# Patient Record
Sex: Female | Born: 2000 | Hispanic: No | Marital: Married | Smoking: Never smoker
Health system: Southern US, Community
[De-identification: ages and names within clinical notes are randomized; demographics above are authoritative.]

## PROBLEM LIST (undated history)

## (undated) ENCOUNTER — Inpatient Hospital Stay (HOSPITAL_COMMUNITY): Payer: Self-pay

## (undated) DIAGNOSIS — Z789 Other specified health status: Secondary | ICD-10-CM

## (undated) HISTORY — PX: NO PAST SURGERIES: SHX2092

---
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
## 2018-01-01 ENCOUNTER — Other Ambulatory Visit: Payer: Self-pay

## 2018-01-01 ENCOUNTER — Ambulatory Visit (INDEPENDENT_AMBULATORY_CARE_PROVIDER_SITE_OTHER): Payer: Medicaid Other

## 2018-01-01 VITALS — BP 128/83 | HR 84 | Wt 146.0 lb

## 2018-01-01 DIAGNOSIS — O163 Unspecified maternal hypertension, third trimester: Secondary | ICD-10-CM

## 2018-01-01 NOTE — Patient Instructions (Signed)
Postpartum Hypertension °Postpartum hypertension is high blood pressure after pregnancy that remains higher than normal for more than two days after delivery. You may not realize that you have postpartum hypertension if your blood pressure is not being checked regularly. In some cases, postpartum hypertension will go away on its own, usually within a week of delivery. However, for some women, medical treatment is required to prevent serious complications, such as seizures or stroke. °The following things can affect your blood pressure: °· The type of delivery you had. °· Having received IV fluids or other medicines during or after delivery. ° °What are the causes? °Postpartum hypertension may be caused by any of the following or by a combination of any of the following: °· Hypertension that existed before pregnancy (chronic hypertension). °· Gestational hypertension. °· Preeclampsia or eclampsia. °· Receiving a lot of fluid through an IV during or after delivery. °· Medicines. °· HELLP syndrome. °· Hyperthyroidism. °· Stroke. °· Other rare neurological or blood disorders. ° °In some cases, the cause may not be known. °What increases the risk? °Postpartum hypertension can be related to one or more risk factors, such as: °· Chronic hypertension. In some cases, this may not have been diagnosed before pregnancy. °· Obesity. °· Type 2 diabetes. °· Kidney disease. °· Family history of preeclampsia. °· Other medical conditions that cause hormonal imbalances. ° °What are the signs or symptoms? °As with all types of hypertension, postpartum hypertension may not have any symptoms. Depending on how high your blood pressure is, you may experience: °· Headaches. These may be mild, moderate, or severe. They may also be steady, constant, or sudden in onset (thunderclap headache). °· Visual changes. °· Dizziness. °· Shortness of breath. °· Swelling of your hands, feet, lower legs, or face. In some cases, you may have swelling in  more than one of these locations. °· Heart palpitations or a racing heartbeat. °· Difficulty breathing while lying down. °· Decreased urination. ° °Other rare signs and symptoms may include: °· Sweating more than usual. This lasts longer than a few days after delivery. °· Chest pain. °· Sudden dizziness when you get up from sitting or lying down. °· Seizures. °· Nausea or vomiting. °· Abdominal pain. ° °How is this diagnosed? °The diagnosis of postpartum hypertension is made through a combination of physical examination findings and testing of your blood and urine. You may also have additional tests, such as a CT scan or an MRI, to check for other complications of postpartum hypertension. °How is this treated? °When blood pressure is high enough to require treatment, your options may include: °· Medicines to reduce blood pressure (antihypertensives). Tell your health care provider if you are breastfeeding or if you plan to breastfeed. There are many antihypertensive medicines that are safe to take while breastfeeding. °· Stopping medicines that may be causing hypertension. °· Treating medical conditions that are causing hypertension. °· Treating the complications of hypertension, such as seizures, stroke, or kidney problems. ° °Your health care provider will also continue to monitor your blood pressure closely and repeatedly until it is within a safe range for you. °Follow these instructions at home: °· Take medicines only as directed by your health care provider. °· Get regular exercise after your health care provider tells you that it is safe. °· Follow your health care provider’s recommendations on fluid and salt restrictions. °· Do not use any tobacco products, including cigarettes, chewing tobacco, or electronic cigarettes. If you need help quitting, ask your health care provider. °·   Keep all follow-up visits as directed by your health care provider. This is important. °Contact a health care provider  if: °· Your symptoms get worse. °· You have new symptoms, such as: °? Headache. °? Dizziness. °? Visual changes. °Get help right away if: °· You develop a severe or sudden headache. °· You have seizures. °· You develop numbness or weakness on one side of your body. °· You have difficulty thinking, speaking, or swallowing. °· You develop severe abdominal pain. °· You develop difficulty breathing, chest pain, a racing heartbeat, or heart palpitations. °These symptoms may represent a serious problem that is an emergency. Do not wait to see if the symptoms will go away. Get medical help right away. Call your local emergency services (911 in the [place] Do not drive yourself to the hospital. °This information is not intended to replace advice given to you by your health care provider. Make sure you discuss any questions you have with your health care provider. °Document Released: 02/25/2014 Document Revised: 11/27/2015 Document Reviewed: 01/06/2014 °Elsevier Interactive Patient Education © 2018 [employer] Inc. ° °

## 2018-01-01 NOTE — Progress Notes (Signed)
Patient here for post partum blood pressure check.  Patient delivered 12/24/2017 vaginally.  Patient is taking amlodipine daily.  Denies headache or visual changes.  Per Dr Marice Potterove, patient can stop amlodipine and follow up in 1 week for blood pressure check.

## 2018-01-09 ENCOUNTER — Ambulatory Visit (INDEPENDENT_AMBULATORY_CARE_PROVIDER_SITE_OTHER): Payer: Medicaid Other | Admitting: General Practice

## 2018-01-09 VITALS — BP 133/70 | HR 63 | Ht 60.0 in | Wt 143.0 lb

## 2018-01-09 DIAGNOSIS — Z0130 Encounter for examination of blood pressure without abnormal findings: Secondary | ICD-10-CM

## 2018-01-09 NOTE — Progress Notes (Signed)
Patient presents to office today for BP check. Per chart review, patient delivered SVD on 6/19 & came to office last week for BP check. Norvasc was discontinued at that time so she is following up today. Patient reports continued headaches but they are relieved by medication. Patient denies dizziness or blurry vision.  Reviewed BP with Dr Debroah LoopArnold who states patient can follow up at pp visit. Patient had no questions.

## 2018-01-30 ENCOUNTER — Encounter: Payer: Self-pay | Admitting: Student

## 2018-01-30 ENCOUNTER — Ambulatory Visit (INDEPENDENT_AMBULATORY_CARE_PROVIDER_SITE_OTHER): Payer: Medicaid Other | Admitting: Student

## 2018-01-30 NOTE — Progress Notes (Deleted)
Subjective:     Lindsey Bennett is a 17 y.o. female who presents for a postpartum visit. She is {1-10:13787} {time; units:18646} postpartum following a {delivery:12449}. I have fully reviewed the prenatal and intrapartum course. The delivery was at *** gestational weeks. Outcome: {delivery outcome:32078}. Anesthesia: {anesthesia types:812}. Postpartum course has been ***. Baby's course has been ***. Baby is feeding by {breast/bottle:69}. Bleeding {vag bleed:12292}. Bowel function is {normal:32111}. Bladder function is {normal:32111}. Patient {is/is not:9024} sexually active. Contraception method is {contraceptive method:5051}. Postpartum depression screening: {neg default:13464::"negative"}.  {Common ambulatory SmartLinks:19316}  Review of Systems {ros; complete:30496}   Objective:    LMP  (Approximate)   General:  {gen appearance:16600}   Breasts:  {breast exam:1202::"inspection negative, no nipple discharge or bleeding, no masses or nodularity palpable"}  Lungs: {lung exam:16931}  Heart:  {heart exam:5510}  Abdomen: {abdomen exam:16834}   Vulva:  {labia exam:12198}  Vagina: {vagina exam:12200}  Cervix:  {cervix exam:14595}  Corpus: {uterus exam:12215}  Adnexa:  {adnexa exam:12223}  Rectal Exam: {rectal/vaginal exam:12274}        Assessment:    *** postpartum exam. Pap smear {done:10129} at today's visit.   Plan:    1. Contraception: {method:5051} 2. *** 3. Follow up in: {1-10:13787} {time; units:19136} or as needed.

## 2018-01-30 NOTE — Patient Instructions (Signed)
Ethinyl Estradiol; Norelgestromin skin patches What is this medicine? ETHINYL ESTRADIOL;NORELGESTROMIN (ETH in il es tra DYE ole; nor el JES troe min) skin patch is used as a contraceptive (birth control method). This medicine combines two types of female hormones, an estrogen and a progestin. This patch is used to prevent ovulation and pregnancy. This medicine may be used for other purposes; ask your health care provider or pharmacist if you have questions. COMMON BRAND NAME(S): Ortho Evra, Xulane What should I tell my health care provider before I take this medicine? They need to know if you have or ever had any of these conditions: -abnormal vaginal bleeding -blood vessel disease or blood clots -breast, cervical, endometrial, ovarian, liver, or uterine cancer -diabetes -gallbladder disease -heart disease or recent heart attack -high blood pressure -high cholesterol -kidney disease -liver disease -migraine headaches -stroke -systemic lupus erythematosus (SLE) -tobacco smoker -an unusual or allergic reaction to estrogens, progestins, other medicines, foods, dyes, or preservatives -pregnant or trying to get pregnant -breast-feeding How should I use this medicine? This patch is applied to the skin. Follow the directions on the prescription label. Apply to clean, dry, healthy skin on the buttock, abdomen, upper outer arm or upper torso, in a place where it will not be rubbed by tight clothing. Do not use lotions or other cosmetics on the site where the patch will go. Press the patch firmly in place for 10 seconds to ensure good contact with the skin. Change the patch every 7 days on the same day of the week for 3 weeks. You will then have a break from the patch for 1 week, after which you will apply a new patch. Do not use your medicine more often than directed. Contact your pediatrician regarding the use of this medicine in children. Special care may be needed. This medicine has been used in  female children who have started having menstrual periods. A patient package insert for the product will be given with each prescription and refill. Read this sheet carefully each time. The sheet may change frequently. Overdosage: If you think you have taken too much of this medicine contact a poison control center or emergency room at once. NOTE: This medicine is only for you. Do not share this medicine with others. What if I miss a dose? You will need to replace your patch once a week as directed. If your patch is lost or falls off, contact your health care professional for advice. You may need to use another form of birth control if your patch has been off for more than 1 day. What may interact with this medicine? Do not take this medicine with the following medication: -dasabuvir; ombitasvir; paritaprevir; ritonavir -ombitasvir; paritaprevir; ritonavir This medicine may also interact with the following medications: -acetaminophen -antibiotics or medicines for infections, especially rifampin, rifabutin, rifapentine, and griseofulvin, and possibly penicillins or tetracyclines -aprepitant -ascorbic acid (vitamin C) -atorvastatin -barbiturate medicines, such as phenobarbital -bosentan -carbamazepine -caffeine -clofibrate -cyclosporine -dantrolene -doxercalciferol -felbamate -grapefruit juice -hydrocortisone -medicines for anxiety or sleeping problems, such as diazepam or temazepam -medicines for diabetes, including pioglitazone -modafinil -mycophenolate -nefazodone -oxcarbazepine -phenytoin -prednisolone -ritonavir or other medicines for HIV infection or AIDS -rosuvastatin -selegiline -soy isoflavones supplements -St. John's wort -tamoxifen or raloxifene -theophylline -thyroid hormones -topiramate -warfarin This list may not describe all possible interactions. Give your health care provider a list of all the medicines, herbs, non-prescription drugs, or dietary supplements  you use. Also tell them if you smoke, drink alcohol, or use illegal drugs.   Some items may interact with your medicine. What should I watch for while using this medicine? Visit your doctor or health care professional for regular checks on your progress. You will need a regular breast and pelvic exam and Pap smear while on this medicine. Use an additional method of contraception during the first cycle that you use this patch. If you have any reason to think you are pregnant, stop using this medicine right away and contact your doctor or health care professional. If you are using this medicine for hormone related problems, it may take several cycles of use to see improvement in your condition. Smoking increases the risk of getting a blood clot or having a stroke while you are using hormonal birth control, especially if you are more than 19 years old. You are strongly advised not to smoke. This medicine can make your body retain fluid, making your fingers, hands, or ankles swell. Your blood pressure can go up. Contact your doctor or health care professional if you feel you are retaining fluid. This medicine can make you more sensitive to the sun. Keep out of the sun. If you cannot avoid being in the sun, wear protective clothing and use sunscreen. Do not use sun lamps or tanning beds/booths. If you wear contact lenses and notice visual changes, or if the lenses begin to feel uncomfortable, consult your eye care specialist. In some women, tenderness, swelling, or minor bleeding of the gums may occur. Notify your dentist if this happens. Brushing and flossing your teeth regularly may help limit this. See your dentist regularly and inform your dentist of the medicines you are taking. If you are going to have elective surgery or a MRI, you may need to stop using this medicine before the surgery or MRI. Consult your health care professional for advice. This medicine does not protect you against HIV infection  (AIDS) or any other sexually transmitted diseases. What side effects may I notice from receiving this medicine? Side effects that you should report to your doctor or health care professional as soon as possible: -breast tissue changes or discharge -changes in vaginal bleeding during your period or between your periods -chest pain -coughing up blood -dizziness or fainting spells -headaches or migraines -leg, arm or groin pain -severe or sudden headaches -stomach pain (severe) -sudden shortness of breath -sudden loss of coordination, especially on one side of the body -speech problems -symptoms of vaginal infection like itching, irritation or unusual discharge -tenderness in the upper abdomen -vomiting -weakness or numbness in the arms or legs, especially on one side of the body -yellowing of the eyes or skin Side effects that usually do not require medical attention (report to your doctor or health care professional if they continue or are bothersome): -breakthrough bleeding and spotting that continues beyond the 3 initial cycles of pills -breast tenderness -mood changes, anxiety, depression, frustration, anger, or emotional outbursts -increased sensitivity to sun or ultraviolet light -nausea -skin rash, acne, or brown spots on the skin -weight gain (slight) This list may not describe all possible side effects. Call your doctor for medical advice about side effects. You may report side effects to FDA at 1-800-FDA-1088. Where should I keep my medicine? Keep out of the reach of children. Store at room temperature between 15 and 30 degrees C (59 and 86 degrees F). Keep the patch in its pouch until time of use. Throw away any unused medicine after the expiration date. Dispose of used patches properly. Since a used patch may   still contain active hormones, fold the patch in half so that it sticks to itself prior to disposal. Throw away in a place where children or pets cannot reach. NOTE:  This sheet is a summary. It may not cover all possible information. If you have questions about this medicine, talk to your doctor, pharmacist, or health care provider.  2018 Elsevier/Gold Standard (2016-03-04 07:59:03) Intrauterine Device Information An intrauterine device (IUD) is inserted into your uterus to prevent pregnancy. There are two types of IUDs available:  Copper IUD-This type of IUD is wrapped in copper wire and is placed inside the uterus. Copper makes the uterus and fallopian tubes produce a fluid that kills sperm. The copper IUD can stay in place for 10 years.  Hormone IUD-This type of IUD contains the hormone progestin (synthetic progesterone). The hormone thickens the cervical mucus and prevents sperm from entering the uterus. It also thins the uterine lining to prevent implantation of a fertilized egg. The hormone can weaken or kill the sperm that get into the uterus. One type of hormone IUD can stay in place for 5 years, and another type can stay in place for 3 years.  Your health care provider will make sure you are a good candidate for a contraceptive IUD. Discuss with your health care provider the possible side effects. Advantages of an intrauterine device  IUDs are highly effective, reversible, long acting, and low maintenance.  There are no estrogen-related side effects.  An IUD can be used when breastfeeding.  IUDs are not associated with weight gain.  The copper IUD works immediately after insertion.  The hormone IUD works right away if inserted within 7 days of your period starting. You will need to use a backup method of birth control for 7 days if the hormone IUD is inserted at any other time in your cycle.  The copper IUD does not interfere with your female hormones.  The hormone IUD can make heavy menstrual periods lighter and decrease cramping.  The hormone IUD can be used for 3 or 5 years.  The copper IUD can be used for 10 years. Disadvantages of an  intrauterine device  The hormone IUD can be associated with irregular bleeding patterns.  The copper IUD can make your menstrual flow heavier and more painful.  You may experience cramping and vaginal bleeding after insertion. This information is not intended to replace advice given to you by your health care provider. Make sure you discuss any questions you have with your health care provider. Document Released: 05/28/2004 Document Revised: 11/30/2015 Document Reviewed: 12/13/2012 Elsevier Interactive Patient Education  2017 Elsevier Inc. Etonogestrel implant What is this medicine? ETONOGESTREL (et oh noe JES trel) is a contraceptive (birth control) device. It is used to prevent pregnancy. It can be used for up to 3 years. This medicine may be used for other purposes; ask your health care provider or pharmacist if you have questions. COMMON BRAND NAME(S): Implanon, Nexplanon What should I tell my health care provider before I take this medicine? They need to know if you have any of these conditions: -abnormal vaginal bleeding -blood vessel disease or blood clots -cancer of the breast, cervix, or liver -depression -diabetes -gallbladder disease -headaches -heart disease or recent heart attack -high blood pressure -high cholesterol -kidney disease -liver disease -renal disease -seizures -tobacco smoker -an unusual or allergic reaction to etonogestrel, other hormones, anesthetics or antiseptics, medicines, foods, dyes, or preservatives -pregnant or trying to get pregnant -breast-feeding How should I use this  medicine? This device is inserted just under the skin on the inner side of your upper arm by a health care professional. Talk to your pediatrician regarding the use of this medicine in children. Special care may be needed. Overdosage: If you think you have taken too much of this medicine contact a poison control center or emergency room at once. NOTE: This medicine is only  for you. Do not share this medicine with others. What if I miss a dose? This does not apply. What may interact with this medicine? Do not take this medicine with any of the following medications: -amprenavir -bosentan -fosamprenavir This medicine may also interact with the following medications: -barbiturate medicines for inducing sleep or treating seizures -certain medicines for fungal infections like ketoconazole and itraconazole -grapefruit juice -griseofulvin -medicines to treat seizures like carbamazepine, felbamate, oxcarbazepine, phenytoin, topiramate -modafinil -phenylbutazone -rifampin -rufinamide -some medicines to treat HIV infection like atazanavir, indinavir, lopinavir, nelfinavir, tipranavir, ritonavir -St. John's wort This list may not describe all possible interactions. Give your health care provider a list of all the medicines, herbs, non-prescription drugs, or dietary supplements you use. Also tell them if you smoke, drink alcohol, or use illegal drugs. Some items may interact with your medicine. What should I watch for while using this medicine? This product does not protect you against HIV infection (AIDS) or other sexually transmitted diseases. You should be able to feel the implant by pressing your fingertips over the skin where it was inserted. Contact your doctor if you cannot feel the implant, and use a non-hormonal birth control method (such as condoms) until your doctor confirms that the implant is in place. If you feel that the implant may have broken or become bent while in your arm, contact your healthcare provider. What side effects may I notice from receiving this medicine? Side effects that you should report to your doctor or health care professional as soon as possible: -allergic reactions like skin rash, itching or hives, swelling of the face, lips, or tongue -breast lumps -changes in emotions or moods -depressed mood -heavy or prolonged menstrual  bleeding -pain, irritation, swelling, or bruising at the insertion site -scar at site of insertion -signs of infection at the insertion site such as fever, and skin redness, pain or discharge -signs of pregnancy -signs and symptoms of a blood clot such as breathing problems; changes in vision; chest pain; severe, sudden headache; pain, swelling, warmth in the leg; trouble speaking; sudden numbness or weakness of the face, arm or leg -signs and symptoms of liver injury like dark yellow or brown urine; general ill feeling or flu-like symptoms; light-colored stools; loss of appetite; nausea; right upper belly pain; unusually weak or tired; yellowing of the eyes or skin -unusual vaginal bleeding, discharge -signs and symptoms of a stroke like changes in vision; confusion; trouble speaking or understanding; severe headaches; sudden numbness or weakness of the face, arm or leg; trouble walking; dizziness; loss of balance or coordination Side effects that usually do not require medical attention (report to your doctor or health care professional if they continue or are bothersome): -acne -back pain -breast pain -changes in weight -dizziness -general ill feeling or flu-like symptoms -headache -irregular menstrual bleeding -nausea -sore throat -vaginal irritation or inflammation This list may not describe all possible side effects. Call your doctor for medical advice about side effects. You may report side effects to FDA at 1-800-FDA-1088. Where should I keep my medicine? This drug is given in a hospital or clinic and will  not be stored at home. NOTE: This sheet is a summary. It may not cover all possible information. If you have questions about this medicine, talk to your doctor, pharmacist, or health care provider.  2018 Elsevier/Gold Standard (2016-01-11 11:19:22)

## 2018-01-30 NOTE — Progress Notes (Addendum)
Subjective:     Lindsey Bennett is a 17 y.o. female who presents for a postpartum visit. She is 5 weeks postpartum following a spontaneous vaginal delivery. I have fully reviewed the prenatal and intrapartum course. The delivery was at 37 gestational weeks. Outcome: spontaneous vaginal delivery. Anesthesia: epidural. Postpartum course has been uneventful. Baby's course has been uneventful. Baby is feeding by breast. Bleeding no bleeding. Bowel function is normal. Bladder function is normal. Patient is not sexually active. Contraception method is none. Postpartum depression screening: negative.  The following portions of the patient's history were reviewed and updated as appropriate: allergies, current medications, past family history, past medical history, past social history, past surgical history and problem list.  Review of Systems Pertinent items are noted in HPI.   Objective:    BP 123/69   Pulse 97   Ht 5' (1.524 m)   Wt 145 lb 12.8 oz (66.1 kg)   LMP  (Approximate)   Breastfeeding? Yes   BMI 28.47 kg/m   General:  alert, cooperative and no distress   Breasts:  inspection negative, no nipple discharge or bleeding, no masses or nodularity palpable  Lungs: clear to auscultation bilaterally  Heart:  regular rate and rhythm, S1, S2 normal, no murmur, click, rub or gallop  Abdomen: soft, non-tender; bowel sounds normal; no masses,  no organomegaly   Vulva:  normal  Vagina: not evaluated  Cervix:  not evaluated  Corpus: not examined  Adnexa:  not evaluated  Rectal Exam: Not performed.        Assessment:    Normal postpartum exam. Pap smear not done at today's visit.  BP normal; not taking any medications.  Plan:    1. Contraception: none 2. Undecided on birth control; will come back in 2 weeks.  3. Follow up in: 2 weeks.

## 2018-01-30 NOTE — Progress Notes (Signed)
Pt is Undecided about contraceptive would like to discuss.

## 2018-02-24 ENCOUNTER — Ambulatory Visit: Payer: Medicaid Other | Admitting: Student

## 2018-03-03 ENCOUNTER — Telehealth: Payer: Self-pay | Admitting: Licensed Clinical Social Worker

## 2018-03-03 NOTE — Telephone Encounter (Signed)
Called.  No answer.  Voicemail left.

## 2018-05-12 ENCOUNTER — Encounter: Payer: Self-pay | Admitting: Family Medicine

## 2018-05-12 ENCOUNTER — Ambulatory Visit (INDEPENDENT_AMBULATORY_CARE_PROVIDER_SITE_OTHER): Payer: Medicaid Other | Admitting: Family Medicine

## 2018-05-12 VITALS — BP 113/64 | HR 67

## 2018-05-12 DIAGNOSIS — Z30016 Encounter for initial prescription of transdermal patch hormonal contraceptive device: Secondary | ICD-10-CM

## 2018-05-12 LAB — POCT PREGNANCY, URINE: Preg Test, Ur: NEGATIVE

## 2018-05-12 MED ORDER — NORELGESTROMIN-ETH ESTRADIOL 150-35 MCG/24HR TD PTWK: 1.0000 | MEDICATED_PATCH | TRANSDERMAL | 12 refills | Status: DC

## 2018-05-12 NOTE — Progress Notes (Signed)
   Subjective:    Lindsey Bennett - 17 y.o. female MRN [id number]  Date of birth: October 14, 2000  HPI  Lindsey Bennett is a 17 y.o. G63P1011 female here for birth control. States that she would like to use the patch. This was recommended to her by her mother who is also using the patch. She does not smoke.    OB History    Gravida  2   Para  1   Term  1   Preterm  0   AB  1   Living  1     SAB  1   TAB  0   Ectopic  0   Multiple  0   Live Births  1             Health Maintenance:  Pap: not indicated  Health Maintenance:  Health Maintenance Due  Topic Date Due  . INFLUENZA VACCINE  02/05/2018    -  reports that she has never smoked. She has never used smokeless tobacco. - Review of Systems: Per HPI. - Past Medical History: Patient Active Problem List   Diagnosis Date Noted  . Spontaneous vaginal delivery 12/24/2017   - Medications: reviewed and updated   Objective:   Physical Exam BP 113/64   Pulse 67   LMP 04/28/2018 (Exact Date)  Gen: NAD, alert, cooperative with exam, well-appearing HEENT: NCAT, PER, clear conjunctiva CV: RRR Resp: Non-labored breathing  Skin: no rashes, normal turgor  Neuro: no gross deficits.  Psych: good insight, alert and oriented     Assessment & Plan:   1. Encounter for initial prescription of transdermal patch hormonal contraceptive device - ortho evra prescribed today - DVT precautions discussed - follow up PRN   Routine preventative health maintenance measures emphasized. Please refer to After Visit Summary for other counseling recommendations.   No follow-ups on file.  Gwenevere Abbot, MD OB Fellow  05/12/2018, 7:17 PM

## 2019-03-18 ENCOUNTER — Encounter (HOSPITAL_COMMUNITY): Payer: Self-pay | Admitting: *Deleted

## 2019-03-18 ENCOUNTER — Inpatient Hospital Stay (HOSPITAL_COMMUNITY): Payer: Medicaid Other

## 2019-03-18 ENCOUNTER — Inpatient Hospital Stay (HOSPITAL_COMMUNITY)
Admission: AD | Admit: 2019-03-18 | Discharge: 2019-03-18 | Disposition: A | Discharge status: Home / Self Care | Payer: Medicaid Other | Attending: Obstetrics and Gynecology | Admitting: Obstetrics and Gynecology

## 2019-03-18 ENCOUNTER — Other Ambulatory Visit: Payer: Self-pay

## 2019-03-18 ENCOUNTER — Telehealth: Payer: Self-pay | Admitting: General Practice

## 2019-03-18 DIAGNOSIS — O3680X0 Pregnancy with inconclusive fetal viability, not applicable or unspecified: Secondary | ICD-10-CM

## 2019-03-18 DIAGNOSIS — Z3A01 Less than 8 weeks gestation of pregnancy: Secondary | ICD-10-CM | POA: Insufficient documentation

## 2019-03-18 DIAGNOSIS — O209 Hemorrhage in early pregnancy, unspecified: Secondary | ICD-10-CM | POA: Insufficient documentation

## 2019-03-18 LAB — CBC WITH DIFFERENTIAL/PLATELET
Abs Immature Granulocytes: 0.05 10*3/uL (ref 0.00–0.07)
Basophils Absolute: 0 10*3/uL (ref 0.0–0.1)
Basophils Relative: 0 %
Eosinophils Absolute: 0.2 10*3/uL (ref 0.0–0.5)
Eosinophils Relative: 2 %
HCT: 40.9 % (ref 36.0–46.0)
Hemoglobin: 13.6 g/dL (ref 12.0–15.0)
Immature Granulocytes: 0 %
Lymphocytes Relative: 24 %
Lymphs Abs: 2.7 10*3/uL (ref 0.7–4.0)
MCH: 26.5 pg (ref 26.0–34.0)
MCHC: 33.3 g/dL (ref 30.0–36.0)
MCV: 79.6 fL — ABNORMAL LOW (ref 80.0–100.0)
Monocytes Absolute: 0.6 10*3/uL (ref 0.1–1.0)
Monocytes Relative: 5 %
Neutro Abs: 7.5 10*3/uL (ref 1.7–7.7)
Neutrophils Relative %: 69 %
Platelets: 362 10*3/uL (ref 150–400)
RBC: 5.14 MIL/uL — ABNORMAL HIGH (ref 3.87–5.11)
RDW: 13.7 % (ref 11.5–15.5)
WBC: 11.1 10*3/uL — ABNORMAL HIGH (ref 4.0–10.5)
nRBC: 0 % (ref 0.0–0.2)

## 2019-03-18 LAB — URINALYSIS, ROUTINE W REFLEX MICROSCOPIC
Bilirubin Urine: NEGATIVE
Glucose, UA: NEGATIVE mg/dL
Hgb urine dipstick: NEGATIVE
Ketones, ur: NEGATIVE mg/dL
Leukocytes,Ua: NEGATIVE
Nitrite: NEGATIVE
Protein, ur: NEGATIVE mg/dL
Specific Gravity, Urine: 1.023 (ref 1.005–1.030)
pH: 7 (ref 5.0–8.0)

## 2019-03-18 LAB — COMPREHENSIVE METABOLIC PANEL
ALT: 22 U/L (ref 0–44)
AST: 21 U/L (ref 15–41)
Albumin: 4.1 g/dL (ref 3.5–5.0)
Alkaline Phosphatase: 79 U/L (ref 38–126)
Anion gap: 8 (ref 5–15)
BUN: 7 mg/dL (ref 6–20)
CO2: 26 mmol/L (ref 22–32)
Calcium: 9.4 mg/dL (ref 8.9–10.3)
Chloride: 104 mmol/L (ref 98–111)
Creatinine, Ser: 0.66 mg/dL (ref 0.44–1.00)
GFR calc Af Amer: >60 mL/min (ref >60)
GFR calc non Af Amer: >60 mL/min (ref >60)
Glucose, Bld: 96 mg/dL (ref 70–99)
Potassium: 4 mmol/L (ref 3.5–5.1)
Sodium: 138 mmol/L (ref 135–145)
Total Bilirubin: 0.3 mg/dL (ref 0.3–1.2)
Total Protein: 7.3 g/dL (ref 6.5–8.1)

## 2019-03-18 LAB — ABO/RH: ABO/RH(D): A POS

## 2019-03-18 LAB — POCT PREGNANCY, URINE: Preg Test, Ur: POSITIVE — AB

## 2019-03-18 LAB — HCG, QUANTITATIVE, PREGNANCY: hCG, Beta Chain, Quant, S: 667 m[IU]/mL — ABNORMAL HIGH (ref <5)

## 2019-03-18 NOTE — Telephone Encounter (Signed)
Patient called into front office stating she is pregnant and has started to bleed. She reports LMP 7/30 and had a positive UPT 2 weeks ago at home. She states she has been having breast tenderness and nausea. Patient reports she started spotting yesterday but it stopped later in the day. She states the spotting has returned again and she is having some cramps. Denies recent intercourse. Advised she go to [facility] for evaluation since she has not been seen yet in this pregnancy. Patient verbalized understanding & had no questions.

## 2019-03-18 NOTE — MAU Provider Note (Signed)
Chief Complaint: Vaginal Bleeding   First Provider Initiated Contact with Patient 03/18/19 2039        SUBJECTIVE HPI: Lindsey Bennett is a 18 y.o. G3P1011 at 6437w0d by LMP who presents to maternity admissions reporting pink spotting.  Has some mild cramping.  No severe pain.. She denies vaginal itching/burning, urinary symptoms, h/a, dizziness, n/v, or fever/chills.    RN note: Had some pink spotting yesterday that stopped. Had more spotting this afternoon. Mild cramping in lower abd this afternoon. LMP 02/04/19   Past Medical History:  Diagnosis Date  . Medical history non-contributory    Past Surgical History:  Procedure Laterality Date  . WISDOM TOOTH EXTRACTION Bilateral 2017   Social History   Socioeconomic History  . Marital status: Married    Spouse name: Not on file  . Number of children: Not on file  . Years of education: Not on file  . Highest education level: Not on file  Occupational History  . Not on file  Social Needs  . Financial resource strain: Not on file  . Food insecurity    Worry: Not on file    Inability: Not on file  . Transportation needs    Medical: Not on file    Non-medical: Not on file  Tobacco Use  . Smoking status: Never Smoker  . Smokeless tobacco: Never Used  Substance and Sexual Activity  . Alcohol use: No  . Drug use: No  . Sexual activity: Not Currently  Lifestyle  . Physical activity    Days per week: Not on file    Minutes per session: Not on file  . Stress: Not on file  Relationships  . Social Musicianconnections    Talks on phone: Not on file    Gets together: Not on file    Attends religious service: Not on file    Active member of club or organization: Not on file    Attends meetings of clubs or organizations: Not on file    Relationship status: Not on file  . Intimate partner violence    Fear of current or ex partner: Not on file    Emotionally abused: Not on file    Physically abused: Not on file    Forced sexual  activity: Not on file  Other Topics Concern  . Not on file  Social History Narrative  . Not on file   No current facility-administered medications on file prior to encounter.    Current Outpatient Medications on File Prior to Encounter  Medication Sig Dispense Refill  . Prenatal Vit-Fe Fumarate-FA (PRENATAL MULTIVITAMIN) TABS tablet Take 1 tablet by mouth daily at 12 noon.    . norelgestromin-ethinyl estradiol (ORTHO EVRA) 150-35 MCG/24HR transdermal patch Place 1 patch onto the skin once a week. 3 patch 12   No Known Allergies  I have reviewed patient's Past Medical Hx, Surgical Hx, Family Hx, Social Hx, medications and allergies.   ROS:  Review of Systems  Constitutional: Negative for chills and fever.  Respiratory: Negative for shortness of breath.   Gastrointestinal: Negative for constipation, diarrhea and nausea.  Genitourinary: Positive for pelvic pain, vaginal bleeding and vaginal discharge.   Review of Systems  Other systems negative   Physical Exam  Physical Exam Patient Vitals for the past 24 hrs:  BP Temp Pulse Resp Height Weight  03/18/19 1945 (!) 127/57 98.8 F (37.1 C) 86 18 5\' 1"  (1.549 m) 78 kg   Constitutional: Well-developed, well-nourished female in no acute distress.  Cardiovascular:  normal rate Respiratory: normal effort GI: Abd soft, non-tender. Pos BS x 4 MS: Extremities nontender, no edema, normal ROM Neurologic: Alert and oriented x 4.  GU: Neg CVAT. Declined pelvic exam  LAB RESULTS Results for orders placed or performed during the hospital encounter of 03/18/19 (from the past 24 hour(s))  Urinalysis, Routine w reflex microscopic     Status: None   Collection Time: 03/18/19  7:56 PM  Result Value Ref Range   Color, Urine YELLOW YELLOW   APPearance CLEAR CLEAR   Specific Gravity, Urine 1.023 1.005 - 1.030   pH 7.0 5.0 - 8.0   Glucose, UA NEGATIVE NEGATIVE mg/dL   Hgb urine dipstick NEGATIVE NEGATIVE   Bilirubin Urine NEGATIVE NEGATIVE    Ketones, ur NEGATIVE NEGATIVE mg/dL   Protein, ur NEGATIVE NEGATIVE mg/dL   Nitrite NEGATIVE NEGATIVE   Leukocytes,Ua NEGATIVE NEGATIVE  Pregnancy, urine POC     Status: Abnormal   Collection Time: 03/18/19  7:59 PM  Result Value Ref Range   Preg Test, Ur POSITIVE (A) NEGATIVE  CBC with Differential/Platelet     Status: Abnormal   Collection Time: 03/18/19  8:05 PM  Result Value Ref Range   WBC 11.1 (H) 4.0 - 10.5 K/uL   RBC 5.14 (H) 3.87 - 5.11 MIL/uL   Hemoglobin 13.6 12.0 - 15.0 g/dL   HCT 85.8 85.0 - 27.7 %   MCV 79.6 (L) 80.0 - 100.0 fL   MCH 26.5 26.0 - 34.0 pg   MCHC 33.3 30.0 - 36.0 g/dL   RDW 41.2 87.8 - 67.6 %   Platelets 362 150 - 400 K/uL   nRBC 0.0 0.0 - 0.2 %   Neutrophils Relative % 69 %   Neutro Abs 7.5 1.7 - 7.7 K/uL   Lymphocytes Relative 24 %   Lymphs Abs 2.7 0.7 - 4.0 K/uL   Monocytes Relative 5 %   Monocytes Absolute 0.6 0.1 - 1.0 K/uL   Eosinophils Relative 2 %   Eosinophils Absolute 0.2 0.0 - 0.5 K/uL   Basophils Relative 0 %   Basophils Absolute 0.0 0.0 - 0.1 K/uL   Immature Granulocytes 0 %   Abs Immature Granulocytes 0.05 0.00 - 0.07 K/uL  Comprehensive metabolic panel     Status: None   Collection Time: 03/18/19  8:05 PM  Result Value Ref Range   Sodium 138 135 - 145 mmol/L   Potassium 4.0 3.5 - 5.1 mmol/L   Chloride 104 98 - 111 mmol/L   CO2 26 22 - 32 mmol/L   Glucose, Bld 96 70 - 99 mg/dL   BUN 7 6 - 20 mg/dL   Creatinine, Ser 7.20 0.44 - 1.00 mg/dL   Calcium 9.4 8.9 - 94.7 mg/dL   Total Protein 7.3 6.5 - 8.1 g/dL   Albumin 4.1 3.5 - 5.0 g/dL   AST 21 15 - 41 U/L   ALT 22 0 - 44 U/L   Alkaline Phosphatase 79 38 - 126 U/L   Total Bilirubin 0.3 0.3 - 1.2 mg/dL   GFR calc non Af Amer >60 >60 mL/min   GFR calc Af Amer >60 >60 mL/min   Anion gap 8 5 - 15  ABO/Rh     Status: None   Collection Time: 03/18/19  8:05 PM  Result Value Ref Range   ABO/RH(D) A POS    No rh immune globuloin      NOT A RH IMMUNE GLOBULIN CANDIDATE, PT RH  POSITIVE Performed at [facility],  [address], [place], [place] [address]   hCG, quantitative, pregnancy     Status: Abnormal   Collection Time: 03/18/19  8:05 PM  Result Value Ref Range   hCG, Beta Chain, Quant, S 667 (H) <5 mIU/mL    --/--/A POS (09/10 2005)  IMAGING US Ob Less Than 14 Weeks With Ob Transvaginal  Result Date: 03/18/2019 CLINICAL DATA:  Bleeding EXAM: OBSTETRIC <14 WK [place] AND TRANSVAGINAL OB US TECHNIQUE: Both transabdominal and transvaginal ultrasound examinations were performed for complete evaluation of the gestation as well as the maternal uterus, adnexal regions, and pelvic cul-de-sac. Transvaginal technique was performed to assess early pregnancy. COMPARISON:  None. FINDINGS: Intrauterine gestational sac: Single Yolk sac:  Not visualized Embryo:  Not visualized Cardiac Activity: Not visualized Heart Rate:   bpm MSD: 2.3 mm   4 w   6 d CRL:    mm    w    d                  [place] EDC: Subchorionic hemorrhage:  None visualized. Maternal uterus/adnexae: 2.2 cm simple appearing right paraovarian cyst. Multiple cysts throughout the endometrium and sub endometrium. No suspicious adnexal mass or free fluid. IMPRESSION: Early intrauterine gestational sac, 4 weeks 6 days by mean sac diameter. No yolk sac or fetal pole currently. This could be followed with repeat ultrasound in 14 days to ensure expected progression. No acute maternal findings. Electronically Signed   By: Rolm Baptise M.D.   On: 03/18/2019 20:48    MAU Management/MDM: Ordered usual first trimester r/o ectopic labs.   Will check baseline Ultrasound to rule out ectopic.  This bleeding/pain can represent a normal pregnancy with bleeding, spontaneous abortion or even an ectopic which can be life-threatening.  The process as listed above helps to determine which of these is present.  Reviewed lab results with patient and her husband Reviewed US findings Until we can see a yolk sac, cannot rule out ectopic  yet Recommend repeat HCG on Sat night or Sunday morning Then would repeat US in about 10 days given early Gestation now  ASSESSMENT 1. Vaginal bleeding in pregnancy, first trimester   2.      Cramping first trimester 3.      Pregnancy of unknown location  PLAN Discharge home Plan to repeat HCG level in 48 hours  Will repeat  Ultrasound in about 7-10 days if HCG levels double appropriately  Ectopic precautions  Pt stable at time of discharge. Encouraged to return here or to other Urgent Care/ED if she develops worsening of symptoms, increase in pain, fever, or other concerning symptoms.    Hansel Feinstein CNM, MSN Certified Nurse-Midwife 03/18/2019  8:39 PM

## 2019-03-18 NOTE — Progress Notes (Signed)
Hansel Feinstein CNM in earlier to discuss test results and d/c plan. Written and verbal d/c instructions given and understanding vocied

## 2019-03-18 NOTE — MAU Note (Signed)
Had some pink spotting yesterday that stopped. Had more spotting this afternoon. Mild cramping in lower abd this afternoon. LMP 02/04/19

## 2019-03-18 NOTE — Discharge Instructions (Signed)
Vaginal Bleeding During Pregnancy, First Trimester ° °A small amount of bleeding from the vagina (spotting) is relatively common during early pregnancy. It usually stops on its own. Various things may cause bleeding or spotting during early pregnancy. Some bleeding may be related to the pregnancy, and some may not. In many cases, the bleeding is normal and is not a problem. However, bleeding can also be a sign of something serious. Be sure to tell your health care provider about any vaginal bleeding right away. °Some possible causes of vaginal bleeding during the first trimester include: °· Infection or inflammation of the cervix. °· Growths (polyps) on the cervix. °· Miscarriage or threatened miscarriage. °· Pregnancy tissue developing outside of the uterus (ectopic pregnancy). °· A mass of tissue developing in the uterus due to an egg being fertilized incorrectly (molar pregnancy). °Follow these instructions at home: °Activity °· Follow instructions from your health care provider about limiting your activity. Ask what activities are safe for you. °· If needed, make plans for someone to help with your regular activities. °· Do not have sex or orgasms until your health care provider says that this is safe. °General instructions °· Take over-the-counter and prescription medicines only as told by your health care provider. °· Pay attention to any changes in your symptoms. °· Do not use tampons or douche. °· Write down how many pads you use each day, how often you change pads, and how soaked (saturated) they are. °· If you pass any tissue from your vagina, save the tissue so you can show it to your health care provider. °· Keep all follow-up visits as told by your health care provider. This is important. °Contact a health care provider if: °· You have vaginal bleeding during any part of your pregnancy. °· You have cramps or labor pains. °· You have a fever. °Get help right away if: °· You have severe cramps in your  back or abdomen. °· You pass large clots or a large amount of tissue from your vagina. °· Your bleeding increases. °· You feel light-headed or weak, or you faint. °· You have chills. °· You are leaking fluid or have a gush of fluid from your vagina. °Summary °· A small amount of bleeding (spotting) from the vagina is relatively common during early pregnancy. °· Various things may cause bleeding or spotting in early pregnancy. °· Be sure to tell your health care provider about any vaginal bleeding right away. °This information is not intended to replace advice given to you by your health care provider. Make sure you discuss any questions you have with your health care provider. °Document Released: 04/03/2005 Document Revised: 10/13/2018 Document Reviewed: 09/26/2016 °Elsevier Patient Education © 2020 [employer] ° °

## 2019-03-20 ENCOUNTER — Other Ambulatory Visit: Payer: Self-pay

## 2019-03-20 ENCOUNTER — Inpatient Hospital Stay (HOSPITAL_COMMUNITY)
Admission: AD | Admit: 2019-03-20 | Discharge: 2019-03-20 | Disposition: A | Discharge status: Home / Self Care | Payer: Medicaid Other | Attending: Obstetrics & Gynecology | Admitting: Obstetrics & Gynecology

## 2019-03-20 DIAGNOSIS — Z3A01 Less than 8 weeks gestation of pregnancy: Secondary | ICD-10-CM | POA: Insufficient documentation

## 2019-03-20 DIAGNOSIS — O0281 Inappropriate change in quantitative human chorionic gonadotropin (hCG) in early pregnancy: Secondary | ICD-10-CM

## 2019-03-20 DIAGNOSIS — O209 Hemorrhage in early pregnancy, unspecified: Secondary | ICD-10-CM | POA: Insufficient documentation

## 2019-03-20 DIAGNOSIS — O3680X0 Pregnancy with inconclusive fetal viability, not applicable or unspecified: Secondary | ICD-10-CM

## 2019-03-20 LAB — HCG, QUANTITATIVE, PREGNANCY: hCG, Beta Chain, Quant, S: 741 m[IU]/mL — ABNORMAL HIGH (ref <5)

## 2019-03-20 NOTE — MAU Note (Signed)
Pt reports to MAU for her HCG labs. Pt reports some increase in bleeding but no pain. Pt reports she doesn't feel she needs to be reevaluated for this problem. Pt denies feeling dizzy or lightheaded.

## 2019-03-20 NOTE — MAU Provider Note (Signed)
History     CSN: [id number]  Arrival date and time: 03/20/19 1902   None     Chief Complaint  Patient presents with  . Labs Only   Lindsey Bennett is a 18 y.o. G3P1011 at [redacted]w[redacted]d who presents today for FU HCG. She states that she is unchanged from her visit 2 days ago. She reports that she is still having some cramps, and the bleeding has slightly increased.   Vaginal Bleeding The patient's primary symptoms include pelvic pain and vaginal bleeding. This is a new problem. The current episode started in the past 7 days. The problem occurs intermittently. The problem has been unchanged. She is pregnant. The vaginal bleeding is spotting. She has not been passing clots. She has not been passing tissue. Nothing aggravates the symptoms. She has tried nothing for the symptoms.    OB History    Gravida  3   Para  1   Term  1   Preterm  0   AB  1   Living  1     SAB  1   TAB  0   Ectopic  0   Multiple  0   Live Births  1           Past Medical History:  Diagnosis Date  . Medical history non-contributory     Past Surgical History:  Procedure Laterality Date  . WISDOM TOOTH EXTRACTION Bilateral 2017    No family history on file.  Social History   Tobacco Use  . Smoking status: Never Smoker  . Smokeless tobacco: Never Used  Substance Use Topics  . Alcohol use: No  . Drug use: No    Allergies: No Known Allergies  Medications Prior to Admission  Medication Sig Dispense Refill Last Dose  . Prenatal Vit-Fe Fumarate-FA (PRENATAL MULTIVITAMIN) TABS tablet Take 1 tablet by mouth daily at 12 noon.       Review of Systems  Genitourinary: Positive for pelvic pain and vaginal bleeding.  All other systems reviewed and are negative.  Physical Exam   Blood pressure (!) 135/57, pulse 80, temperature 98.7 F (37.1 C), temperature source Oral, resp. rate 17, last menstrual period 02/04/2019, currently breastfeeding.  Physical Exam  Nursing note and vitals  reviewed. Constitutional: She is oriented to person, place, and time. She appears well-developed. No distress.  HENT:  Head: Normocephalic.  Cardiovascular: Normal rate.  Respiratory: Effort normal.  GI: Soft. There is no abdominal tenderness. There is no rebound.  Neurological: She is alert and oriented to person, place, and time.  Skin: Skin is warm and dry.  Psychiatric: She has a normal mood and affect.   Results for Lindsey Bennett, Lindsey Bennett (MRN [id number]) as of 03/20/2019 20:37  Ref. Range 03/18/2019 20:05 03/18/2019 20:42 03/20/2019 19:32  HCG, Beta Chain, Quant, S Latest Ref Range: <5 mIU/mL 667 (H)  741 (H)   MAU Course  Procedures  MDM Reviewed results with the patient. HCG did not rise as expected. Patient offered MTX or repeat HCG in 48 hours. This is a desired pregnancy, and she would like to repeat HCG on Tuesday before taking definitive active.   Assessment and Plan   1. Pregnancy, location unknown   2. Inappropriate change in quantitative hCG in early pregnancy    DC home 1st Trimester precautions  Bleeding precautions Ectopic precautions RX: none  Return to MAU as needed Repeat HCG in 48 hours, [facility] [place] [facility]   Follow-up Information  [facility] Follow up.   Specialty: Obstetrics and Gynecology Why: Tuesday for repeat blood work  Contact information: [address] [place], [place] [id number] [place] [id number] [place] DNP, [place]  03/20/19  9:00 PM

## 2019-03-20 NOTE — Discharge Instructions (Signed)

## 2019-03-21 ENCOUNTER — Other Ambulatory Visit: Payer: Self-pay

## 2019-03-21 ENCOUNTER — Encounter (HOSPITAL_COMMUNITY): Payer: Self-pay | Admitting: *Deleted

## 2019-03-21 ENCOUNTER — Inpatient Hospital Stay (HOSPITAL_COMMUNITY): Payer: Medicaid Other

## 2019-03-21 ENCOUNTER — Inpatient Hospital Stay (HOSPITAL_COMMUNITY)
Admission: AD | Admit: 2019-03-21 | Discharge: 2019-03-21 | Disposition: A | Discharge status: Home / Self Care | Payer: Medicaid Other | Attending: Obstetrics and Gynecology | Admitting: Obstetrics and Gynecology

## 2019-03-21 DIAGNOSIS — O4690 Antepartum hemorrhage, unspecified, unspecified trimester: Secondary | ICD-10-CM

## 2019-03-21 DIAGNOSIS — Z3A01 Less than 8 weeks gestation of pregnancy: Secondary | ICD-10-CM | POA: Insufficient documentation

## 2019-03-21 DIAGNOSIS — Z6790 Unspecified blood type, Rh positive: Secondary | ICD-10-CM

## 2019-03-21 DIAGNOSIS — O3680X0 Pregnancy with inconclusive fetal viability, not applicable or unspecified: Secondary | ICD-10-CM

## 2019-03-21 DIAGNOSIS — O039 Complete or unspecified spontaneous abortion without complication: Secondary | ICD-10-CM | POA: Insufficient documentation

## 2019-03-21 DIAGNOSIS — O26891 Other specified pregnancy related conditions, first trimester: Secondary | ICD-10-CM

## 2019-03-21 LAB — WET PREP, GENITAL
Clue Cells Wet Prep HPF POC: NONE SEEN
Sperm: NONE SEEN
Trich, Wet Prep: NONE SEEN
Yeast Wet Prep HPF POC: NONE SEEN

## 2019-03-21 LAB — URINALYSIS, ROUTINE W REFLEX MICROSCOPIC
Bacteria, UA: NONE SEEN
Bilirubin Urine: NEGATIVE
Glucose, UA: NEGATIVE mg/dL
Ketones, ur: NEGATIVE mg/dL
Nitrite: NEGATIVE
Protein, ur: 30 mg/dL — AB
RBC / HPF: >50 RBC/hpf — ABNORMAL HIGH (ref 0–5)
Specific Gravity, Urine: 1.025 (ref 1.005–1.030)
pH: 6 (ref 5.0–8.0)

## 2019-03-21 LAB — CBC
HCT: 39.8 % (ref 36.0–46.0)
Hemoglobin: 13.3 g/dL (ref 12.0–15.0)
MCH: 26.7 pg (ref 26.0–34.0)
MCHC: 33.4 g/dL (ref 30.0–36.0)
MCV: 79.9 fL — ABNORMAL LOW (ref 80.0–100.0)
Platelets: 355 10*3/uL (ref 150–400)
RBC: 4.98 MIL/uL (ref 3.87–5.11)
RDW: 13.6 % (ref 11.5–15.5)
WBC: 10.7 10*3/uL — ABNORMAL HIGH (ref 4.0–10.5)
nRBC: 0 % (ref 0.0–0.2)

## 2019-03-21 LAB — HCG, QUANTITATIVE, PREGNANCY: hCG, Beta Chain, Quant, S: 545 m[IU]/mL — ABNORMAL HIGH (ref <5)

## 2019-03-21 NOTE — MAU Provider Note (Signed)
History     CSN: [id number]  Arrival date and time: 03/21/19 1622   First Provider Initiated Contact with Patient 03/21/19 1704      Chief Complaint  Patient presents with  . Vaginal Bleeding  . Abdominal Pain   Ms. Lindsey Bennett is a 18 y.o. G3P1011 at [redacted]w[redacted]d who presents to MAU for vaginal bleeding which began 4days ago. Pt reports the bleeding started out light and pink, but today got heavier and pt reports passing multiple 50 cent-sized clots. Pt reports bleeding is heavier than a period. Pt reports changing pad x2 in past hour and reports pads were saturated with blood.  Of note, this is the patient's 3rd visit to MAU since 03/18/2019. Pt has been diagnosed with a pregnancy of unknown location with an abnormally rising hCG after 48hrs. Pt was offered MTX vs. repeat hCG and pt elected repeat hCG which is scheduled at the [facility] clinic on Tuesday 03/23/2019.  Passing blood clots? yes Blood soaking clothes? no Lightheaded/dizzy? no Significant pelvic pain or cramping? only when passing blood, rates 9/10, denies pelvic pain at this time Passed any tissue? Yes - pt reports she saw "something white" while using restroom in MAU, but threw away pad Hx ectopic pregnancy? no Hx of PID, GYN surgery? no  Current pregnancy problems? pt has not yet been seen this pregnancy Blood Type? A Positive Allergies? NKDA Current medications? none Current PNC & next appt? [facility], 03/23/2019 for hCG  Pt denies vaginal discharge/odor/itching. Pt denies N/V, abdominal pain, constipation, diarrhea, or urinary problems. Pt denies fever, chills, fatigue, sweating or changes in appetite. Pt denies SOB or chest pain. Pt denies dizziness, HA, light-headedness, weakness.  Pt's partner present for entire visit.   OB History    Gravida  3   Para  1   Term  1   Preterm  0   AB  1   Living  1     SAB  1   TAB  0   Ectopic  0   Multiple  0   Live Births  1           Past Medical  History:  Diagnosis Date  . Medical history non-contributory     Past Surgical History:  Procedure Laterality Date  . WISDOM TOOTH EXTRACTION Bilateral 2017    History reviewed. No pertinent family history.  Social History   Tobacco Use  . Smoking status: Never Smoker  . Smokeless tobacco: Never Used  Substance Use Topics  . Alcohol use: No  . Drug use: No    Allergies: No Known Allergies  Medications Prior to Admission  Medication Sig Dispense Refill Last Dose  . Prenatal Vit-Fe Fumarate-FA (PRENATAL MULTIVITAMIN) TABS tablet Take 1 tablet by mouth daily at 12 noon.       Review of Systems  Constitutional: Negative for chills, diaphoresis, fatigue and fever.  Respiratory: Negative for shortness of breath.   Cardiovascular: Negative for chest pain.  Gastrointestinal: Negative for abdominal pain, constipation, diarrhea, nausea and vomiting.  Genitourinary: Positive for pelvic pain and vaginal bleeding. Negative for dysuria, flank pain, frequency, urgency and vaginal discharge.  Neurological: Negative for dizziness, weakness, light-headedness and headaches.   Physical Exam   Blood pressure 131/64, pulse 91, temperature 99.2 F (37.3 C), temperature source Oral, resp. rate 17, last menstrual period 02/04/2019, SpO2 97 %, currently breastfeeding.  Patient Vitals for the past 24 hrs:  BP Temp Temp src Pulse Resp SpO2  03/21/19 1649 131/64 99.2 F (  37.3 C) Oral 91 17 97 %   Physical Exam  Constitutional: She is oriented to person, place, and time. She appears well-developed and well-nourished. No distress.  HENT:  Head: Normocephalic and atraumatic.  Respiratory: Effort normal.  GI: Soft. She exhibits no distension and no mass. There is no abdominal tenderness. There is no rebound and no guarding.  Genitourinary: There is no rash, tenderness or lesion on the right labia. There is no rash, tenderness or lesion on the left labia. Uterus is not enlarged and not tender.  Cervix exhibits no motion tenderness, no discharge and no friability. Right adnexum displays no mass, no tenderness and no fullness. Left adnexum displays no mass, no tenderness and no fullness.    Vaginal bleeding present.     No vaginal discharge or tenderness.  There is bleeding in the vagina. No tenderness in the vagina.    Genitourinary Comments: -minimal blood mixed with mucus visualzed in posterior fornix. No active bleeding visualized. Small clot visible at external os, unable to be removed with Fox swab.   Neurological: She is alert and oriented to person, place, and time.  Skin: Skin is warm and dry. She is not diaphoretic.  Psychiatric: She has a normal mood and affect. Her behavior is normal. Judgment and thought content normal.   Results for orders placed or performed during the hospital encounter of 03/21/19 (from the past 24 hour(s))  Wet prep, genital     Status: Abnormal   Collection Time: 03/21/19  5:11 PM   Specimen: Cervix  Result Value Ref Range   Yeast Wet Prep HPF POC NONE SEEN NONE SEEN   Trich, Wet Prep NONE SEEN NONE SEEN   Clue Cells Wet Prep HPF POC NONE SEEN NONE SEEN   WBC, Wet Prep HPF POC MANY (A) NONE SEEN   Sperm NONE SEEN   CBC     Status: Abnormal   Collection Time: 03/21/19  5:25 PM  Result Value Ref Range   WBC 10.7 (H) 4.0 - 10.5 K/uL   RBC 4.98 3.87 - 5.11 MIL/uL   Hemoglobin 13.3 12.0 - 15.0 g/dL   HCT 18.839.8 41.636.0 - 60.646.0 %   MCV 79.9 (L) 80.0 - 100.0 fL   MCH 26.7 26.0 - 34.0 pg   MCHC 33.4 30.0 - 36.0 g/dL   RDW 30.113.6 60.111.5 - 09.315.5 %   Platelets 355 150 - 400 K/uL   nRBC 0.0 0.0 - 0.2 %  hCG, quantitative, pregnancy     Status: Abnormal   Collection Time: 03/21/19  5:25 PM  Result Value Ref Range   hCG, Beta Chain, Quant, S 545 (H) <5 mIU/mL  Urinalysis, Routine w reflex microscopic     Status: Abnormal   Collection Time: 03/21/19  7:25 PM  Result Value Ref Range   Color, Urine YELLOW YELLOW   APPearance HAZY (A) CLEAR   Specific Gravity,  Urine 1.025 1.005 - 1.030   pH 6.0 5.0 - 8.0   Glucose, UA NEGATIVE NEGATIVE mg/dL   Hgb urine dipstick LARGE (A) NEGATIVE   Bilirubin Urine NEGATIVE NEGATIVE   Ketones, ur NEGATIVE NEGATIVE mg/dL   Protein, ur 30 (A) NEGATIVE mg/dL   Nitrite NEGATIVE NEGATIVE   Leukocytes,Ua TRACE (A) NEGATIVE   RBC / HPF >50 (H) 0 - 5 RBC/hpf   WBC, UA 21-50 0 - 5 WBC/hpf   Bacteria, UA NONE SEEN NONE SEEN   Squamous Epithelial / LPF 6-10 0 - 5   Mucus PRESENT  US Ob Less Than 14 Weeks With Ob Transvaginal  Result Date: 03/21/2019 CLINICAL DATA:  18 year old pregnant female with vaginal bleeding. Estimated gestational age of [redacted] weeks 3 days by LMP. EXAM: OBSTETRIC <14 WK [place] AND TRANSVAGINAL OB US TECHNIQUE: Both transabdominal and transvaginal ultrasound examinations were performed for complete evaluation of the gestation as well as the maternal uterus, adnexal regions, and pelvic cul-de-sac. Transvaginal technique was performed to assess early pregnancy. COMPARISON:  03/18/2019 ultrasound FINDINGS: Intrauterine gestational sac: None. The previously identified possible gestational sac on 03/18/2019 is not visualized. Yolk sac:  Not Visualized. Embryo:  Not Visualized. Maternal uterus/adnexae: The ovaries bilaterally are unremarkable. A trace amount of free pelvic fluid is noted. No suspicious adnexal mass noted. IMPRESSION: 1. No evidence of intrauterine gestational sac. The small cystic intrauterine structure identified on 03/18/2019 is no longer visualized. This is worrisome for 1st trimester pregnancy failure. 2. Trace free fluid.  No suspicious adnexal mass. 3. Consider ultrasound/Beta HCG follow-up. Electronically Signed   By: Margarette Canada M.D.   On: 03/21/2019 18:53   US Ob Less Than 14 Weeks With Ob Transvaginal  Result Date: 03/18/2019 CLINICAL DATA:  Bleeding EXAM: OBSTETRIC <14 WK [place] AND TRANSVAGINAL OB US TECHNIQUE: Both transabdominal and transvaginal ultrasound examinations were performed for  complete evaluation of the gestation as well as the maternal uterus, adnexal regions, and pelvic cul-de-sac. Transvaginal technique was performed to assess early pregnancy. COMPARISON:  None. FINDINGS: Intrauterine gestational sac: Single Yolk sac:  Not visualized Embryo:  Not visualized Cardiac Activity: Not visualized Heart Rate:   bpm MSD: 2.3 mm   4 w   6 d CRL:    mm    w    d                  [place] EDC: Subchorionic hemorrhage:  None visualized. Maternal uterus/adnexae: 2.2 cm simple appearing right paraovarian cyst. Multiple cysts throughout the endometrium and sub endometrium. No suspicious adnexal mass or free fluid. IMPRESSION: Early intrauterine gestational sac, 4 weeks 6 days by mean sac diameter. No yolk sac or fetal pole currently. This could be followed with repeat ultrasound in 14 days to ensure expected progression. No acute maternal findings. Electronically Signed   By: Rolm Baptise M.D.   On: 03/18/2019 20:48   MAU Course  Procedures  MDM -VB + pelvic cramping in first trimester, pregnancy of unknown anatomic location -r/o ectopic -UA: hazy/lg hgb/30PRO/trace leuks, sending urine for culture -CBC: WNL, H/H 13.3/39.8 -[place]: previously identified gestational sac on 03/18/2019 not visualized today, trace amount of free fluid, no suspicious adnexal mass -hCG: 545 (was 741 yesterday) -ABO: A Positive -WetPrep: many WBCs, otherwise WNL -GC/CT collected -suspect miscarriage, discussed with patient and partner -pt discharged to home in stable condition  Orders Placed This Encounter  Procedures  . Wet prep, genital    Standing Status:   Standing    Number of Occurrences:   1  . Culture, OB Urine    Standing Status:   Standing    Number of Occurrences:   1  . US OB LESS THAN 14 WEEKS WITH OB TRANSVAGINAL    Standing Status:   Standing    Number of Occurrences:   1    Order Specific Question:   Symptom/Reason for Exam    Answer:   Vaginal bleeding in pregnancy [[id number]]    Order  Specific Question:   Symptom/Reason for Exam    Answer:   Pregnancy of unknown anatomic location [  [id number]]  . Urinalysis, Routine w reflex microscopic    Standing Status:   Standing    Number of Occurrences:   1  . CBC    Standing Status:   Standing    Number of Occurrences:   1  . hCG, quantitative, pregnancy    Standing Status:   Standing    Number of Occurrences:   1  . Discharge patient    Order Specific Question:   Discharge disposition    Answer:   01-Home or Self Care [1]    Order Specific Question:   Discharge patient date    Answer:   03/21/2019   No orders of the defined types were placed in this encounter.  Assessment and Plan   1. Miscarriage   2. Vaginal bleeding in pregnancy   3. Pregnancy of unknown anatomic location   4. Pelvic pain affecting pregnancy in first trimester, antepartum   5. Blood type, Rh positive    Allergies as of 03/21/2019   No Known Allergies     Medication List    TAKE these medications   prenatal multivitamin Tabs tablet Take 1 tablet by mouth daily at 12 noon.      -will call with culture results, if positive -message sent to [facility] to cancel visits for this week and reschedule for hCG in 1wk and provider/hCG visit in 2wks -pt advised will follow hCG to zero -strict VB/pain/infection/return MAU precautions given -pt discharged to home in stable condition  Joni Reiningicole E Tamecka Milham 03/21/2019, 7:48 PM

## 2019-03-21 NOTE — Discharge Instructions (Signed)
Miscarriage °A miscarriage is the loss of an unborn baby (fetus) before the 20th week of pregnancy. Most miscarriages happen during the first 3 months of pregnancy. Sometimes, a miscarriage can happen before a woman knows that she is pregnant. °Having a miscarriage can be an emotional experience. If you have had a miscarriage, talk with your health care provider about any questions you may have about miscarrying, the grieving process, and your plans for future pregnancy. °What are the causes? °A miscarriage may be caused by: °· Problems with the genes or chromosomes of the fetus. These problems make it impossible for the baby to develop normally. They are often the result of random errors that occur early in the development of the baby, and are not passed from parent to child (not inherited). °· Infection of the cervix or uterus. °· Conditions that affect hormone balance in the body. °· Problems with the cervix, such as the cervix opening and thinning before pregnancy is at term (cervical insufficiency). °· Problems with the uterus. These may include: °? A uterus with an abnormal shape. °? Fibroids in the uterus. °? Congenital abnormalities. These are problems that were present at birth. °· Certain medical conditions. °· Smoking, drinking alcohol, or using drugs. °· Injury (trauma). °In many cases, the cause of a miscarriage is not known. °What are the signs or symptoms? °Symptoms of this condition include: °· Vaginal bleeding or spotting, with or without cramps or pain. °· Pain or cramping in the abdomen or lower back. °· Passing fluid, tissue, or blood clots from the vagina. °How is this diagnosed? °This condition may be diagnosed based on: °· A physical exam. °· Ultrasound. °· Blood tests. °· Urine tests. °How is this treated? °Treatment for a miscarriage is sometimes not necessary if you naturally pass all the tissue that was in your uterus. If necessary, this condition may be treated with: °· Dilation and  curettage (D&C). This is a procedure in which the cervix is stretched open and the lining of the uterus (endometrium) is scraped. This is done only if tissue from the fetus or placenta remains in the body (incomplete miscarriage). °· Medicines, such as: °? Antibiotic medicine, to treat infection. °? Medicine to help the body pass any remaining tissue. °? Medicine to reduce (contract) the size of the uterus. These medicines may be given if you have a lot of bleeding. °If you have Rh negative blood and your baby was Rh positive, you will need a shot of a medicine called Rh immunoglobulinto protect your future babies from Rh blood problems. "Rh-negative" and "Rh-positive" refer to whether or not the blood has a specific protein found on the surface of red blood cells (Rh factor). °Follow these instructions at home: °Medicines ° °· Take over-the-counter and prescription medicines only as told by your health care provider. °· If you were prescribed antibiotic medicine, take it as told by your health care provider. Do not stop taking the antibiotic even if you start to feel better. °· Do not take NSAIDs, such as aspirin and ibuprofen, unless they are approved by your health care provider. These medicines can cause bleeding. °Activity °· Rest as directed. Ask your health care provider what activities are safe for you. °· Have someone help with home and family responsibilities during this time. °General instructions °· Keep track of the number of sanitary pads you use each day and how soaked (saturated) they are. Write down this information. °· Monitor the amount of tissue or blood clots that   you pass from your vagina. Save any large amounts of tissue for your health care provider to examine. °· Do not use tampons, douche, or have sex until your health care provider approves. °· To help you and your partner with the process of grieving, talk with your health care provider or seek counseling. °· When you are ready, meet with  your health care provider to discuss any important steps you should take for your health. Also, discuss steps you should take to have a healthy pregnancy in the future. °· Keep all follow-up visits as told by your health care provider. This is important. °Where to find more information °· The American Congress of Obstetricians [employer] Gynecologists: www.acog.org °· [place] Department of Health and Human Services Office of Women’s Health: www.womenshealth.gov °Contact a health care provider if: °· You have a fever or chills. °· You have a foul smelling vaginal discharge. °· You have more bleeding instead of less. °Get help right away if: °· You have severe cramps or pain in your back or abdomen. °· You pass blood clots or tissue from your vagina that is walnut-sized or larger. °· You soak more than 1 regular sanitary pad in an hour. °· You become light-headed or weak. °· You pass out. °· You have feelings of sadness that take over your thoughts, or you have thoughts of hurting yourself. °Summary °· Most miscarriages happen in the first 3 months of pregnancy. Sometimes miscarriage happens before a woman even knows that she is pregnant. °· Follow your health care provider's instruction for home care. Keep all follow-up appointments. °· To help you and your partner with the process of grieving, talk with your health care provider or seek counseling. °This information is not intended to replace advice given to you by your health care provider. Make sure you discuss any questions you have with your health care provider. °Document Released: 12/18/2000 Document Revised: 10/16/2018 Document Reviewed: 07/30/2016 °Elsevier Patient Education © 2020 [employer] Inc[employer] ° °

## 2019-03-21 NOTE — MAU Note (Signed)
Shaylee Stanislawski is a 18 y.o. at [redacted]w[redacted]d here in MAU reporting:  +vaginal bleeding. States has been bleeding but had gotten really heavy around 2 hours ago. In the past two hours she has changed her pad 3 times and covered front to back she endorses.   +clots; the size of 50 cent pieces  +Lower abdominal pain. Cramping. contant Pain score: 9/10. Has not taken any medication Vitals:   03/21/19 1649  BP: 131/64  Pulse: 91  Resp: 17  Temp: 99.2 F (37.3 C)  SpO2: 97%    Lab orders placed from triage: ua. Patient states she is unable to go at this time.

## 2019-03-23 ENCOUNTER — Other Ambulatory Visit: Payer: Self-pay | Admitting: *Deleted

## 2019-03-23 ENCOUNTER — Ambulatory Visit: Payer: Medicaid Other | Admitting: *Deleted

## 2019-03-23 ENCOUNTER — Encounter: Payer: Self-pay | Admitting: *Deleted

## 2019-03-23 DIAGNOSIS — O039 Complete or unspecified spontaneous abortion without complication: Secondary | ICD-10-CM

## 2019-03-23 LAB — CULTURE, OB URINE: Culture: NO GROWTH

## 2019-03-23 LAB — GC/CHLAMYDIA PROBE AMP (~~LOC~~) NOT AT ARMC
Chlamydia: NEGATIVE
Neisseria Gonorrhea: NEGATIVE

## 2019-03-23 NOTE — Progress Notes (Deleted)
Called pt regarding missed appt today @ 1100 for Stat BHCG. Her mobile number had a busy tone - unable to leave message. She did not answer home phone # and message was left to call us back. MyChart message was also sent to pt.

## 2019-03-25 ENCOUNTER — Ambulatory Visit: Payer: Medicaid Other

## 2019-03-29 ENCOUNTER — Other Ambulatory Visit: Payer: Medicaid Other

## 2019-03-29 ENCOUNTER — Other Ambulatory Visit: Payer: Self-pay

## 2019-03-29 DIAGNOSIS — O039 Complete or unspecified spontaneous abortion without complication: Secondary | ICD-10-CM

## 2019-03-30 LAB — BETA HCG QUANT (REF LAB): hCG Quant: 2 m[IU]/mL

## 2019-04-02 ENCOUNTER — Telehealth: Payer: Self-pay | Admitting: Nurse Practitioner

## 2019-04-02 NOTE — Telephone Encounter (Signed)
Attempted to call patient about her appointment on 9/28 @ 9:35. No answer left voicemail instructing patient to wear a face mask for the entire appointment and no visitors are allowed during the visit. Patient instructed not to attend the appointment if she was any symptoms. Symptom list and office number left.  °

## 2019-04-05 ENCOUNTER — Encounter: Payer: Self-pay | Admitting: Nurse Practitioner

## 2019-04-05 ENCOUNTER — Ambulatory Visit (INDEPENDENT_AMBULATORY_CARE_PROVIDER_SITE_OTHER): Payer: Medicaid Other | Admitting: Nurse Practitioner

## 2019-04-05 ENCOUNTER — Other Ambulatory Visit: Payer: Self-pay

## 2019-04-05 VITALS — BP 126/78 | HR 75 | Temp 98.7°F | Ht 61.0 in | Wt 171.7 lb

## 2019-04-05 DIAGNOSIS — Z3009 Encounter for other general counseling and advice on contraception: Secondary | ICD-10-CM | POA: Diagnosis not present

## 2019-04-05 DIAGNOSIS — Z6832 Body mass index (BMI) 32.0-32.9, adult: Secondary | ICD-10-CM | POA: Insufficient documentation

## 2019-04-05 DIAGNOSIS — O039 Complete or unspecified spontaneous abortion without complication: Secondary | ICD-10-CM | POA: Diagnosis not present

## 2019-04-05 HISTORY — DX: Body mass index (BMI) 32.0-32.9, adult: Z68.32

## 2019-04-05 NOTE — Progress Notes (Signed)
GYNECOLOGY OFFICE VISIT NOTE   History:  18 y.o. [id number] here today for miscarriage follow up. She denies any abnormal vaginal discharge, bleeding, pelvic pain or other concerns. She is sleeping well and eating 3 meals a day.  She no longer is crying and still wonders if having a second miscarriage is normal.  Past Medical History:  Diagnosis Date   Medical history non-contributory     Past Surgical History:  Procedure Laterality Date   WISDOM TOOTH EXTRACTION Bilateral 2017    The following portions of the patient's history were reviewed and updated as appropriate: allergies, current medications, past family history, past medical history, past social history, past surgical history and problem list.   Health Maintenance:  Pap not indicated based on age.  Flu shot she will get at [facility] as insurance does not cover flu shot.  Review of Systems:  Pertinent items noted in HPI and remainder of comprehensive ROS otherwise negative.  Objective:  Physical Exam BP 126/78    Pulse 75    Temp 98.7 F (37.1 C)    Ht 5\' 1"  (1.549 m)    Wt 171 lb 11.2 oz (77.9 kg)    LMP 02/04/2019    Breastfeeding Unknown Comment: SAB   BMI 32.44 kg/m  CONSTITUTIONAL: Well-developed, well-nourished female in no acute distress.  HENT:  Normocephalic, atraumatic. External right and left ear normal.  EYES: Conjunctivae and EOM are normal. Pupils are equal, round.  No scleral icterus.  NECK: Normal range of motion, supple, no masses SKIN: Skin is warm and dry. No rash noted. Not diaphoretic. No erythema. No pallor. NEUROLOGIC: Alert and oriented to person, place, and time. Normal muscle tone coordination. No cranial nerve deficit noted. PSYCHIATRIC: Normal mood and affect. Normal behavior. Normal judgment and thought content. MUSCULOSKELETAL: Normal range of motion. No edema noted.  Labs and Imaging US Ob Less Than 14 Weeks With Ob Transvaginal  Result Date: 03/21/2019 CLINICAL DATA:  18 year old pregnant  female with vaginal bleeding. Estimated gestational age of [redacted] weeks 3 days by LMP. EXAM: OBSTETRIC <14 WK [place] AND TRANSVAGINAL OB US TECHNIQUE: Both transabdominal and transvaginal ultrasound examinations were performed for complete evaluation of the gestation as well as the maternal uterus, adnexal regions, and pelvic cul-de-sac. Transvaginal technique was performed to assess early pregnancy. COMPARISON:  03/18/2019 ultrasound FINDINGS: Intrauterine gestational sac: None. The previously identified possible gestational sac on 03/18/2019 is not visualized. Yolk sac:  Not Visualized. Embryo:  Not Visualized. Maternal uterus/adnexae: The ovaries bilaterally are unremarkable. A trace amount of free pelvic fluid is noted. No suspicious adnexal mass noted. IMPRESSION: 1. No evidence of intrauterine gestational sac. The small cystic intrauterine structure identified on 03/18/2019 is no longer visualized. This is worrisome for 1st trimester pregnancy failure. 2. Trace free fluid.  No suspicious adnexal mass. 3. Consider ultrasound/Beta HCG follow-up. Electronically Signed   By: Margarette Canada M.D.   On: 03/21/2019 18:53   US Ob Less Than 14 Weeks With Ob Transvaginal  Result Date: 03/18/2019 CLINICAL DATA:  Bleeding EXAM: OBSTETRIC <14 WK [place] AND TRANSVAGINAL OB US TECHNIQUE: Both transabdominal and transvaginal ultrasound examinations were performed for complete evaluation of the gestation as well as the maternal uterus, adnexal regions, and pelvic cul-de-sac. Transvaginal technique was performed to assess early pregnancy. COMPARISON:  None. FINDINGS: Intrauterine gestational sac: Single Yolk sac:  Not visualized Embryo:  Not visualized Cardiac Activity: Not visualized Heart Rate:   bpm MSD: 2.3 mm   4 w   6 d  CRL:    mm    w    d                  [place] EDC: Subchorionic hemorrhage:  None visualized. Maternal uterus/adnexae: 2.2 cm simple appearing right paraovarian cyst. Multiple cysts throughout the endometrium and sub  endometrium. No suspicious adnexal mass or free fluid. IMPRESSION: Early intrauterine gestational sac, 4 weeks 6 days by mean sac diameter. No yolk sac or fetal pole currently. This could be followed with repeat ultrasound in 14 days to ensure expected progression. No acute maternal findings. Electronically Signed   By: Charlett Nose M.D.   On: 03/18/2019 20:48   Last BHCG on 03-29-19 was less than 5 and considered to be zero.  Assessment & Plan:  1. Family planning counseling Advised BMI needs to be under 25 for very best health. Does not want contraception at this time. Plans another conception - advise to wait and start after 2 menstrual cycles. Advised to continue PNV or mutivitamin daily in anticipation of upcoming pregnancy  2. Follow-up visit after miscarriage Was worried as she had one miscarriage in 2018 before her son was born and now another miscarriage now in 2020 after she had her son in 2019.  Advised that miscarriage may occur in as many as one in four pregnancies.   Routine preventative health maintenance measures emphasized. Please refer to After Visit Summary for other counseling recommendations.   Return in about 1 year (around 04/04/2020).   Total face-to-face time with patient: 10 minutes.  Over 50% of encounter was spent on counseling and coordination of care.  Nolene Bernheim, RN, MSN, NP-BC Nurse Practitioner, [facility] for [employer], [facility] 04/05/2019 10:15 AM

## 2019-04-16 IMAGING — US US OB TRANSVAGINAL
1 series · 15 of 28 positions shown · non-contrast
Comparison: None.

CLINICAL DATA: First trimester vaginal bleeding.

EXAM:
OBSTETRIC <14 WK US AND TRANSVAGINAL OB US
TECHNIQUE: Both transabdominal and transvaginal ultrasound examinations were
performed for complete evaluation of the gestation as well as the
maternal uterus, adnexal regions, and pelvic cul-de-sac.
Transvaginal technique was performed to assess early pregnancy.

[Series 1: us ob transvaginal · 15 of 75 slices shown]
[im 1/75]
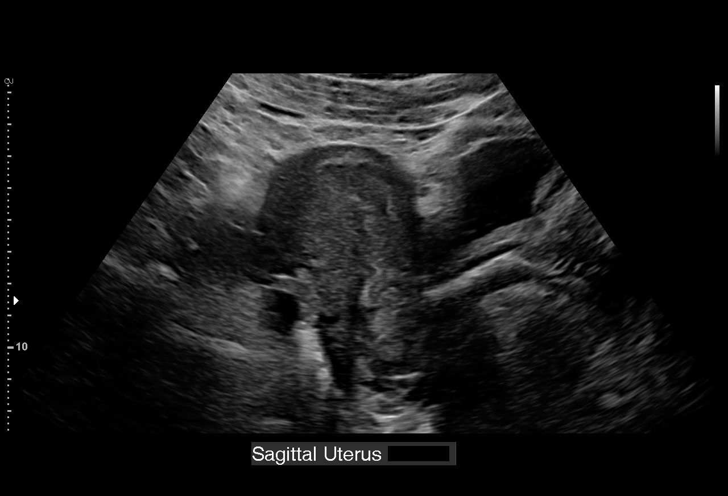
[im 6/75]
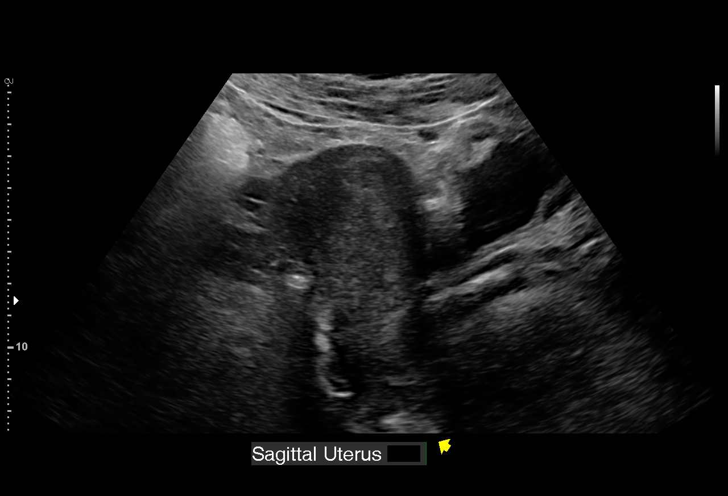
[im 11/75]
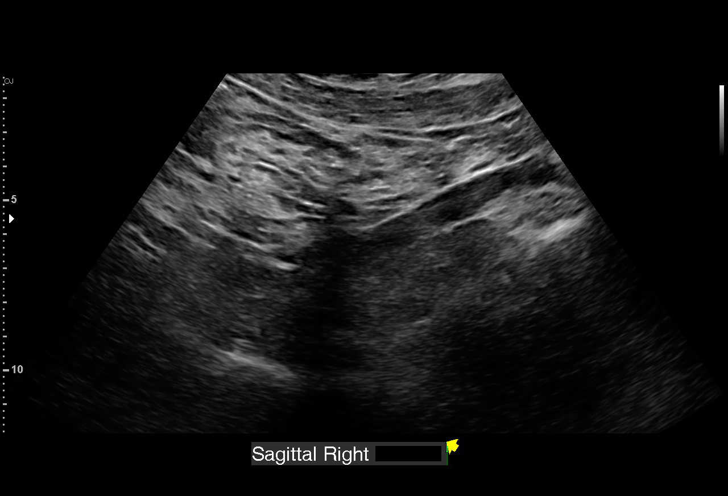
[im 17/75]
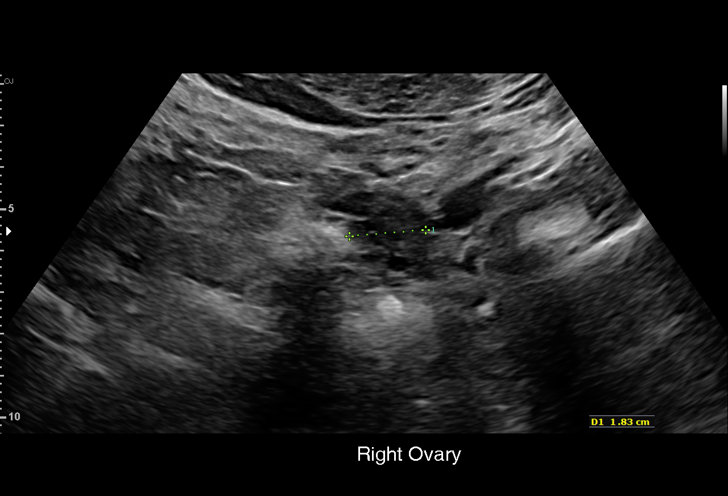
[im 22/75]
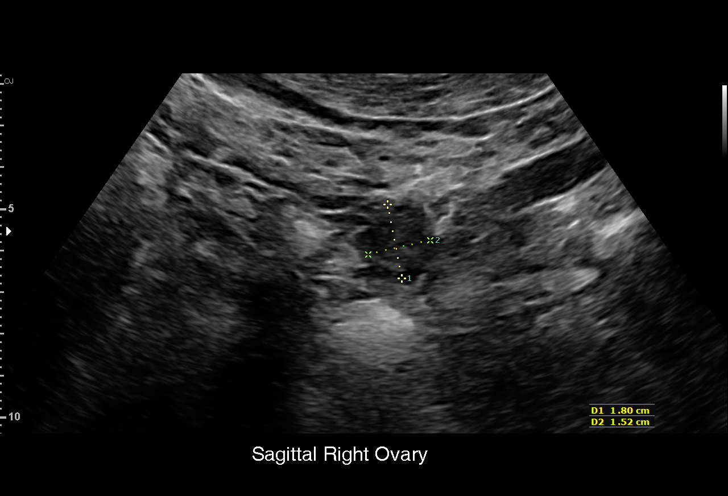
[im 28/75]
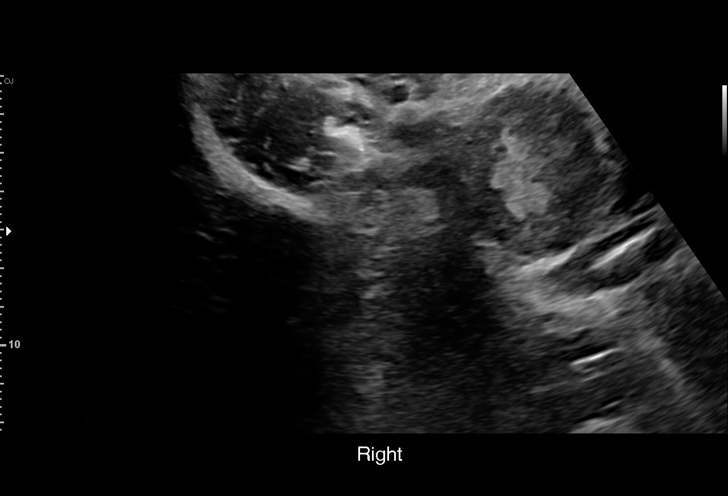
[im 33/75]
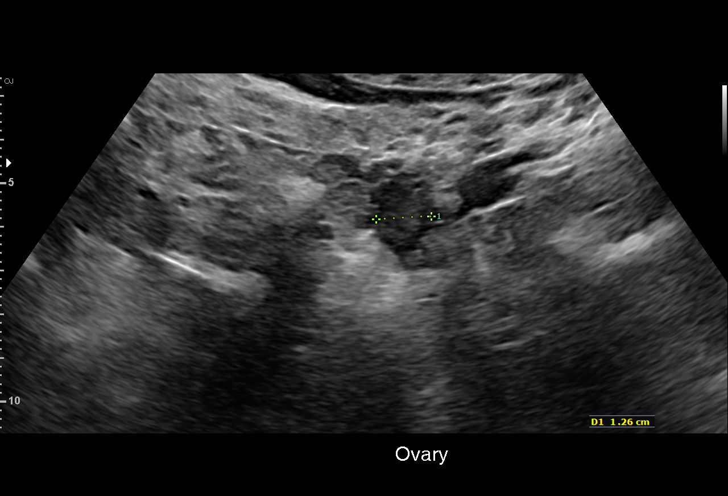
[im 39/75]
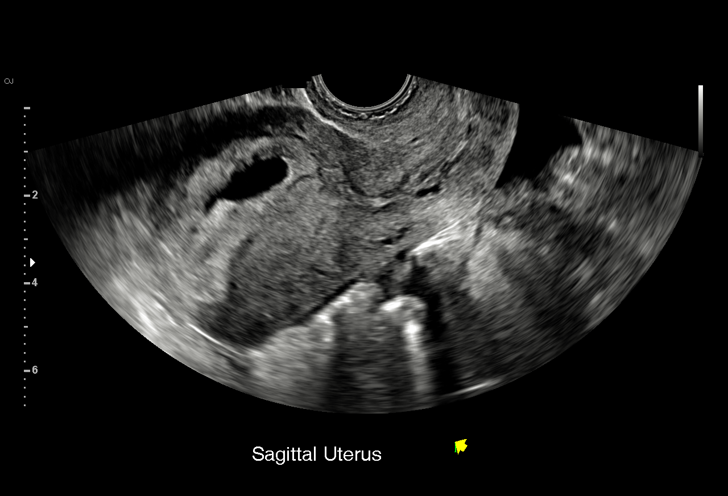
[im 42/75]
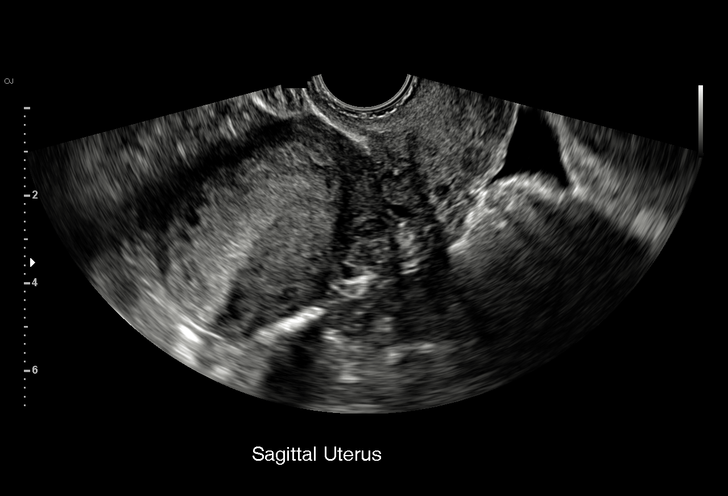
[im 47/75]
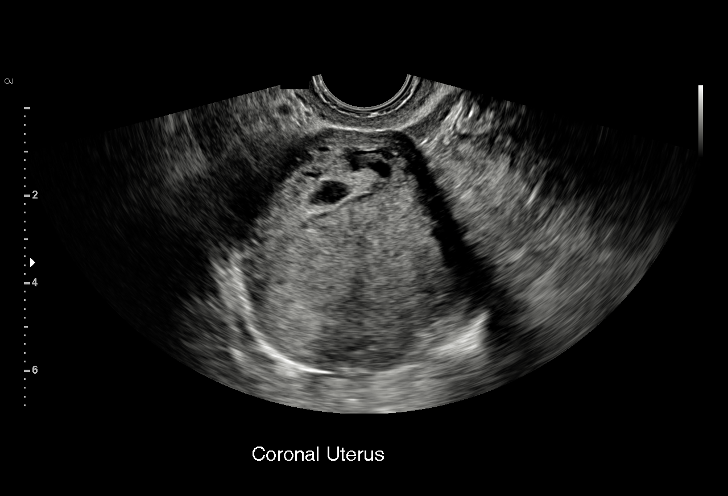
[im 53/75]
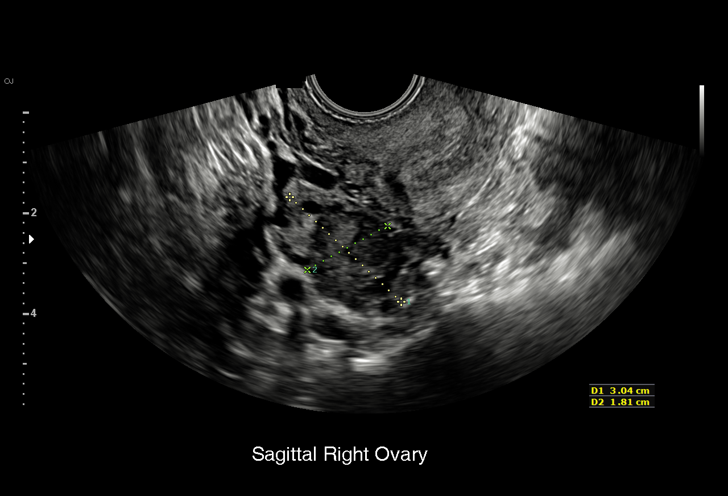
[im 58/75]
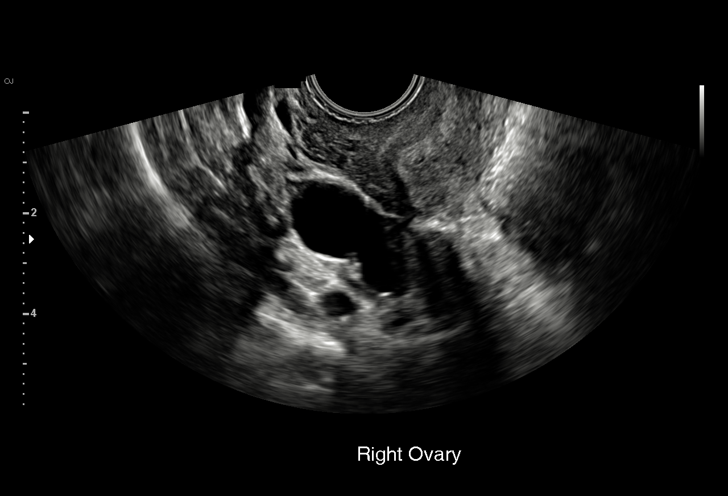
[im 64/75]
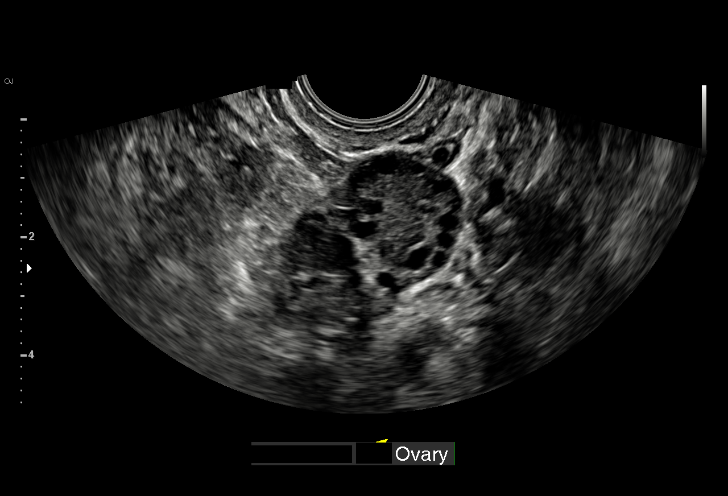
[im 69/75]
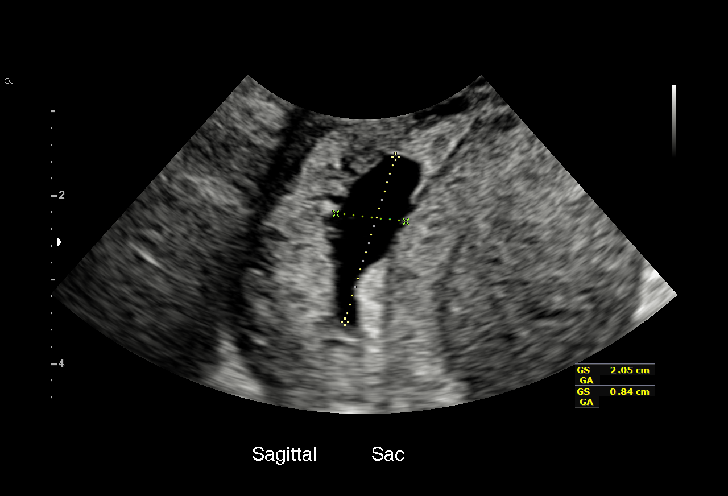
[im 75/75]
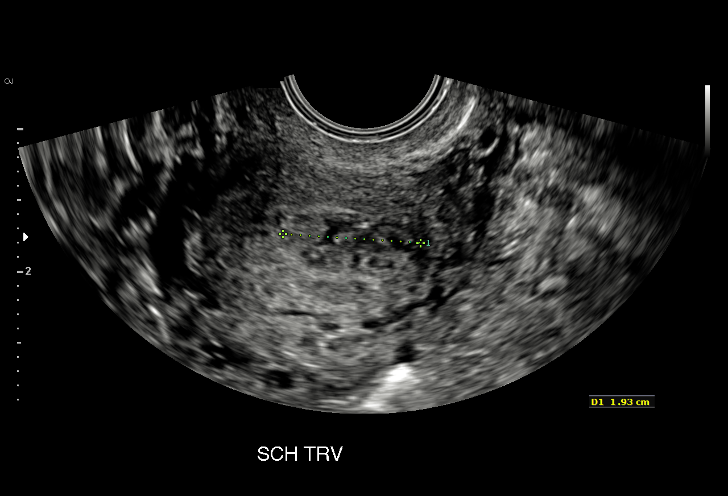

[15 of 28 positions shown; findings below may reference images not displayed]

FINDINGS: Intrauterine gestational sac: Single, slightly irregular in shape.

Yolk sac:  Not Visualized.

Embryo:  Not Visualized.

Cardiac Activity: Not Visualized.

MSD: 13  mm   6 w   0  d

Maternal uterus/adnexae:

Subchorionic hemorrhage: Small

Right ovary: Normal

Left ovary: Normal

Other :None

Free fluid:  Trace
IMPRESSION: 1. Probable early intrauterine gestational sac, but no yolk sac,
fetal pole, or cardiac activity yet visualized. Recommend follow-up
quantitative B-HCG levels and follow-up US in 14 days to assess
viability. This recommendation follows SRU consensus guidelines:
Diagnostic Criteria for Nonviable Pregnancy Early in the First
Trimester. N Engl J Med 2414; [DATE].
2. Small subchorionic hemorrhage.

## 2020-07-08 NOTE — L&D Delivery Note (Addendum)
LABOR COURSE Ms. Lindsey Bennett is a 20yo female who presented to the MAU after SOL. Upon arrival she was 6/90/-2. She was brought to the L&D floor and rapidly progressed to 8.5/100/-1 with a bulging bag. She was AROMed at [phone] and rapidly progressed to complete.   Delivery Note Called to room and patient was complete and pushing. Head delivered ROA. A single nuchal cord was present and easily reducible. Shoulder and body delivered in usual fashion. At  1013 a healthy female was delivered via Vaginal, Spontaneous vertex presentation.  Infant with spontaneous cry, placed on mother's abdomen, dried and stimulated. Cord clamped x 2 after 1-minute delay, and cut by FOB. Cord blood drawn. Placenta delivered spontaneously with gentle cord traction and appeared intact. Fundus firm with massage and Pitocin. Labia, perineum, vagina, and cervix inspected with second-degree perineal tear that was repaired in the usual fashion using 3-0 Vicryl suture.    APGAR: 9, 9; weight  pending.   Cord: 3VC with the following complications: None.   Anesthesia: lidocaine Episiotomy: None Lacerations: 2nd degree perineal tear Suture Repair: 3.0 vicryl Est. Blood Loss (mL): 441  Mom to postpartum.  Baby to Couplet care / Skin to Skin.  Alicia Amel, MD 02/24/21 10:46 AM    GME ATTESTATION:  I was gloved and present for the duration of the delivery. I performed the 2nd degree perineal laceration repair with local lidocaine given before. I agree with the findings and the plan of care as documented in the resident's note, edits are added.   Leticia Penna, DO OB Fellow, Faculty [facility], Center for [facility] 02/24/2021 7:31 PM

## 2020-07-28 ENCOUNTER — Encounter: Payer: Self-pay | Admitting: *Deleted

## 2020-08-04 ENCOUNTER — Other Ambulatory Visit (HOSPITAL_COMMUNITY)
Admission: RE | Admit: 2020-08-04 | Discharge: 2020-08-04 | Disposition: A | Discharge status: Home / Self Care | Payer: Medicaid Other | Source: Ambulatory Visit | Attending: Certified Nurse Midwife | Admitting: Certified Nurse Midwife

## 2020-08-04 ENCOUNTER — Ambulatory Visit (INDEPENDENT_AMBULATORY_CARE_PROVIDER_SITE_OTHER): Payer: Self-pay | Admitting: Certified Nurse Midwife

## 2020-08-04 ENCOUNTER — Encounter: Payer: Self-pay | Admitting: Certified Nurse Midwife

## 2020-08-04 ENCOUNTER — Other Ambulatory Visit: Payer: Self-pay

## 2020-08-04 VITALS — BP 144/80 | HR 92 | Wt 184.0 lb

## 2020-08-04 DIAGNOSIS — Z3481 Encounter for supervision of other normal pregnancy, first trimester: Secondary | ICD-10-CM | POA: Diagnosis present

## 2020-08-04 DIAGNOSIS — Z23 Encounter for immunization: Secondary | ICD-10-CM

## 2020-08-04 DIAGNOSIS — Z8759 Personal history of other complications of pregnancy, childbirth and the puerperium: Secondary | ICD-10-CM | POA: Insufficient documentation

## 2020-08-04 DIAGNOSIS — Z3480 Encounter for supervision of other normal pregnancy, unspecified trimester: Secondary | ICD-10-CM | POA: Insufficient documentation

## 2020-08-04 DIAGNOSIS — Z3A10 10 weeks gestation of pregnancy: Secondary | ICD-10-CM

## 2020-08-04 HISTORY — DX: Personal history of other complications of pregnancy, childbirth and the puerperium: Z87.59

## 2020-08-04 MED ORDER — ASPIRIN 81 MG PO CHEW: 81.0000 mg | CHEWABLE_TABLET | Freq: Every day | ORAL | 3 refills | Status: DC

## 2020-08-04 NOTE — Progress Notes (Signed)
Bedside U/S shows single IUP  FHT of 176 BPM and CRL 25.78mm  GA [redacted]w[redacted]d

## 2020-08-04 NOTE — Progress Notes (Addendum)
Subjective:   Lindsey Bennett is a 20 y.o. G4P1011 at [redacted]w[redacted]d by LMP being seen today for her first obstetrical visit.  Her obstetrical history is significant for pre-eclampsia. Patient does intend to breast feed. Pregnancy history fully reviewed.  Patient reports nausea and vomiting. About twice a day, has not tried anything  HISTORY: OB History  Gravida Para Term Preterm AB Living  4 1 1  0 1 1  SAB IAB Ectopic Multiple Live Births  1 0 0 0 1    # Outcome Date GA Lbr Len/2nd Weight Sex Delivery Anes PTL Lv  4 Current           3 Term 12/24/17 [redacted]w[redacted]d 05:55 / 00:50 6 lb 7.5 oz (2.935 kg) M Vag-Spont EPI  LIV     Name: CRUZ-AVILA,BOY Amaani     Apgar1: 8  Apgar5: 9  2 SAB 03/2017          1 Gravida            Past Medical History:  Diagnosis Date  . Medical history non-contributory    Past Surgical History:  Procedure Laterality Date  . NO PAST SURGERIES    . WISDOM TOOTH EXTRACTION Bilateral 2017   History reviewed. No pertinent family history. Social History   Tobacco Use  . Smoking status: Never Smoker  . Smokeless tobacco: Never Used  Vaping Use  . Vaping Use: Never used  Substance Use Topics  . Alcohol use: No  . Drug use: No   No Known Allergies Current Outpatient Medications on File Prior to Visit  Medication Sig Dispense Refill  . Prenatal Vit-Fe Fumarate-FA (PRENATAL MULTIVITAMIN) TABS tablet Take 1 tablet by mouth daily at 12 noon.     No current facility-administered medications on file prior to visit.     Indications for ASA therapy (per uptodate) One of the following: Previous pregnancy with preeclampsia, especially early onset and with an adverse outcome Yes Multifetal gestation Yes Chronic hypertension No Type 1 or 2 diabetes mellitus No Chronic kidney disease No Autoimmune disease (antiphospholipid syndrome, systemic lupus erythematosus) No   Two or more of the following: Nulliparity No Obesity (body mass index >30 kg/m2) Yes Family  history of preeclampsia in mother or sister No Age ?35 years No Sociodemographic characteristics (African American race, low socioeconomic level) No Personal risk factors (eg, previous pregnancy with low birth weight or small for gestational age infant, previous adverse pregnancy outcome [eg, stillbirth], interval >10 years between pregnancies) No  Indications for early 1 hour GTT (per uptodate)  BMI >25 (>23 in Asian women) AND one of the following  Gestational diabetes mellitus in a previous pregnancy Yes Glycated hemoglobin ?5.7 percent (39 mmol/mol), impaired glucose tolerance, or impaired fasting glucose on previous testing No First-degree relative with diabetes No High-risk race/ethnicity (eg, African American, Latino, Native American, 2018 American, Pacific Islander) Yes History of cardiovascular disease No Hypertension or on therapy for hypertension No High-density lipoprotein cholesterol level <35 mg/dL ([place] mmol/L) and/or a triglyceride level >250 mg/dL (0.63 mmol/L) No Polycystic ovary syndrome No Physical inactivity Yes Other clinical condition associated with insulin resistance (eg, severe obesity, acanthosis nigricans) No Previous birth of an infant weighing ?4000 g No Previous stillbirth of unknown cause No   Exam   Vitals:   08/04/20 0826  BP: (!) 144/80  Pulse: 92  Weight: 184 lb (83.5 kg)   Fetal Heart Rate (bpm): 176bpm  Uterus:     Pelvic Exam: Perineum: no hemorrhoids, normal  perineum   Vulva: normal external genitalia, no lesions   Vagina:  normal mucosa, normal discharge   Cervix: no lesions and normal, pap smear collected    Adnexa: normal adnexa and no mass, fullness, tenderness   Bony Pelvis: average  System: General: well-developed, well-nourished female in no acute distress   Breast:  Not examined   Skin: normal coloration and turgor, no rashes   Neurologic: oriented, normal, negative, normal mood   Extremities: normal strength, tone, and  muscle mass, ROM of all joints is normal   HEENT extraocular movement intact and sclera clear   Mouth/Teeth Not examined   Neck supple and no masses   Cardiovascular: regular rate and rhythm   Respiratory:  no respiratory distress, normal breath sounds   Abdomen: soft, non-tender; bowel sounds normal; no masses,  no organomegaly     Assessment:   Pregnancy: P6P9509 Patient Active Problem List   Diagnosis Date Noted  . Supervision of other normal pregnancy, antepartum 08/04/2020  . History of pre-eclampsia 08/04/2020  . BMI 32.0-32.9,adult 04/05/2019     Plan:  1. Encounter for supervision of other normal pregnancy in first trimester - Obstetric panel - HIV antibody (with reflex) - Hepatitis C Antibody - Hemoglobinopathy Evaluation - Culture, OB Urine - Cytology - PAP( [place]) - [place] bedside; Future - Babyscripts Schedule Optimization  2. Supervision of other normal pregnancy, antepartum  3. History of pre-eclampsia - start bASA after 12 weeks - Comprehensive metabolic panel - Protein / creatinine ratio, urine   Initial labs drawn. Early 1 hour GTT >A1C Started on ASA -yes Flu vax today Continue prenatal vitamins. Genetic Screening discussed, First trimester screen, Quad screen and NIPS: undecided. Ultrasound discussed; fetal anatomic survey: requested. Problem list reviewed and updated The nature of [facility] [facility] for [facility] with multiple MDs and other Advanced Practice Providers was explained to patient; also emphasized that fellows, residents, and students are part of our team. Routine obstetric precautions reviewed Return in about 4 weeks (around 09/01/2020). virtual   Donette Larry 9:12 AM 08/04/20

## 2020-08-04 NOTE — Patient Instructions (Signed)

## 2020-08-05 LAB — PROTEIN / CREATININE RATIO, URINE
Creatinine, Urine: 268 mg/dL (ref 20–275)
Protein/Creat Ratio: 63 mg/g creat (ref 21–161)
Protein/Creatinine Ratio: 0.063 mg/mg creat (ref 0.021–0.16)
Total Protein, Urine: 17 mg/dL (ref 5–24)

## 2020-08-05 LAB — COMPREHENSIVE METABOLIC PANEL
AG Ratio: 1.7 (calc) (ref 1.0–2.5)
ALT: 9 U/L (ref 6–29)
AST: 12 U/L (ref 10–30)
Albumin: 4.2 g/dL (ref 3.6–5.1)
Alkaline phosphatase (APISO): 71 U/L (ref 31–125)
BUN/Creatinine Ratio: 11 (calc) (ref 6–22)
BUN: 6 mg/dL — ABNORMAL LOW (ref 7–25)
CO2: 23 mmol/L (ref 20–32)
Calcium: 9.6 mg/dL (ref 8.6–10.2)
Chloride: 104 mmol/L (ref 98–110)
Creat: 0.55 mg/dL (ref 0.50–1.10)
Globulin: 2.5 g/dL (calc) (ref 1.9–3.7)
Glucose, Bld: 82 mg/dL (ref 65–99)
Potassium: 4.1 mmol/L (ref 3.5–5.3)
Sodium: 136 mmol/L (ref 135–146)
Total Bilirubin: 0.3 mg/dL (ref 0.2–1.2)
Total Protein: 6.7 g/dL (ref 6.1–8.1)

## 2020-08-05 LAB — HEMOGLOBIN A1C
Hgb A1c MFr Bld: 5.5 % of total Hgb (ref <5.7)
Mean Plasma Glucose: 111 mg/dL
eAG (mmol/L): 6.2 mmol/L

## 2020-08-07 ENCOUNTER — Encounter: Payer: Self-pay | Admitting: Certified Nurse Midwife

## 2020-08-07 DIAGNOSIS — O09899 Supervision of other high risk pregnancies, unspecified trimester: Secondary | ICD-10-CM | POA: Insufficient documentation

## 2020-08-07 DIAGNOSIS — O99891 Other specified diseases and conditions complicating pregnancy: Secondary | ICD-10-CM | POA: Insufficient documentation

## 2020-08-07 LAB — CULTURE, OB URINE

## 2020-08-07 LAB — URINE CULTURE, OB REFLEX

## 2020-08-08 LAB — OBSTETRIC PANEL
Absolute Monocytes: 518 cells/uL (ref 200–950)
Antibody Screen: NOT DETECTED
Basophils Absolute: 32 cells/uL (ref 0–200)
Basophils Relative: 0.3 %
Eosinophils Absolute: 151 cells/uL (ref 15–500)
Eosinophils Relative: 1.4 %
HCT: 42.1 % (ref 35.0–45.0)
Hemoglobin: 14.3 g/dL (ref 11.7–15.5)
Hepatitis B Surface Ag: NONREACTIVE
Lymphs Abs: 2300 cells/uL (ref 850–3900)
MCH: 28.3 pg (ref 27.0–33.0)
MCHC: 34 g/dL (ref 32.0–36.0)
MCV: 83.4 fL (ref 80.0–100.0)
MPV: 10.1 fL (ref 7.5–12.5)
Monocytes Relative: 4.8 %
Neutro Abs: 7798 cells/uL (ref 1500–7800)
Neutrophils Relative %: 72.2 %
Platelets: 321 10*3/uL (ref 140–400)
RBC: 5.05 10*6/uL (ref 3.80–5.10)
RDW: 13.5 % (ref 11.0–15.0)
RPR Ser Ql: NONREACTIVE
Rubella: <0.9 Index — ABNORMAL LOW
Total Lymphocyte: 21.3 %
WBC: 10.8 10*3/uL (ref 3.8–10.8)

## 2020-08-08 LAB — HEMOGLOBINOPATHY EVALUATION
Fetal Hemoglobin Testing: <1 % (ref 0.0–1.9)
HCT: 44.8 % (ref 35.0–45.0)
Hemoglobin A2 - HGBRFX: 1.4 % — ABNORMAL LOW (ref 2.2–3.2)
Hemoglobin: 14.4 g/dL (ref 11.7–15.5)
Hgb A: 97.9 % (ref >96.0)
MCH: 28 pg (ref 27.0–33.0)
MCV: 87 fL (ref 80.0–100.0)
RBC: 5.15 10*6/uL — ABNORMAL HIGH (ref 3.80–5.10)
RDW: 13.5 % (ref 11.0–15.0)

## 2020-08-08 LAB — HEPATITIS C ANTIBODY
Hepatitis C Ab: NONREACTIVE
SIGNAL TO CUT-OFF: 0.01 (ref <1.00)

## 2020-08-08 LAB — HIV ANTIBODY (ROUTINE TESTING W REFLEX): HIV 1&2 Ab, 4th Generation: NONREACTIVE

## 2020-08-08 LAB — EXTRA URINE SPECIMEN

## 2020-08-09 ENCOUNTER — Telehealth: Payer: Self-pay | Admitting: *Deleted

## 2020-08-09 LAB — CYTOLOGY - PAP
Chlamydia: POSITIVE — AB
Comment: NEGATIVE
Comment: NORMAL
Diagnosis: NEGATIVE
Diagnosis: REACTIVE
Neisseria Gonorrhea: NEGATIVE

## 2020-08-09 MED ORDER — AZITHROMYCIN 250 MG PO TABS: ORAL_TABLET | ORAL | 0 refills | Status: DC

## 2020-08-09 NOTE — Telephone Encounter (Signed)
Pt notified that she tested positive for Chlamydia.  RX for 1 Gm Azithromycin sent to [facility].  Pt is aware that her partner needs to go to the local HD and be treated as well.  Will do TOC at next visit.

## 2020-08-11 ENCOUNTER — Encounter: Payer: Self-pay | Admitting: Certified Nurse Midwife

## 2020-08-11 DIAGNOSIS — O98819 Other maternal infectious and parasitic diseases complicating pregnancy, unspecified trimester: Secondary | ICD-10-CM | POA: Insufficient documentation

## 2020-08-11 DIAGNOSIS — A749 Chlamydial infection, unspecified: Secondary | ICD-10-CM | POA: Insufficient documentation

## 2020-08-22 ENCOUNTER — Telehealth: Payer: Self-pay | Admitting: *Deleted

## 2020-08-22 NOTE — Telephone Encounter (Signed)
Left patient an urgent message that [facility] does not accept [facility], and to contact case worker prior to virtual appointment on 09/01/2020 to try to get it switched to 1 of the 3 plans that [facility] does except.

## 2020-09-01 ENCOUNTER — Telehealth (INDEPENDENT_AMBULATORY_CARE_PROVIDER_SITE_OTHER): Payer: Medicaid Other | Admitting: Certified Nurse Midwife

## 2020-09-01 VITALS — BP 117/69

## 2020-09-01 DIAGNOSIS — O09899 Supervision of other high risk pregnancies, unspecified trimester: Secondary | ICD-10-CM

## 2020-09-01 DIAGNOSIS — O09292 Supervision of pregnancy with other poor reproductive or obstetric history, second trimester: Secondary | ICD-10-CM

## 2020-09-01 DIAGNOSIS — O98812 Other maternal infectious and parasitic diseases complicating pregnancy, second trimester: Secondary | ICD-10-CM

## 2020-09-01 DIAGNOSIS — Z283 Underimmunization status: Secondary | ICD-10-CM

## 2020-09-01 DIAGNOSIS — A749 Chlamydial infection, unspecified: Secondary | ICD-10-CM

## 2020-09-01 DIAGNOSIS — Z8759 Personal history of other complications of pregnancy, childbirth and the puerperium: Secondary | ICD-10-CM

## 2020-09-01 DIAGNOSIS — Z3480 Encounter for supervision of other normal pregnancy, unspecified trimester: Secondary | ICD-10-CM

## 2020-09-01 DIAGNOSIS — Z3A14 14 weeks gestation of pregnancy: Secondary | ICD-10-CM

## 2020-09-01 DIAGNOSIS — O99891 Other specified diseases and conditions complicating pregnancy: Secondary | ICD-10-CM

## 2020-09-01 NOTE — Progress Notes (Signed)
   OBSTETRICS PRENATAL VIRTUAL VISIT ENCOUNTER NOTE  Provider location: Center for [facility] at [place]   I connected with [name] on 09/01/20 at  8:30 AM EST by MyChart Video Encounter at home and verified that I am speaking with the correct person using two identifiers.   I discussed the limitations, risks, security and privacy concerns of performing an evaluation and management service virtually and the availability of in person appointments. I also discussed with the patient that there may be a patient responsible charge related to this service. The patient expressed understanding and agreed to proceed. Subjective:  Lindsey Bennett is a 20 y.o. G4P1011 at [redacted]w[redacted]d being seen today for ongoing prenatal care.  She is currently monitored for the following issues for this high-risk pregnancy and has BMI 32.0-32.9,adult; Supervision of other normal pregnancy, antepartum; History of pre-eclampsia; Rubella non-immune status, antepartum; and Chlamydia infection affecting pregnancy on their problem list.  Patient reports nausea resolved 1 week ago.   .  .   . Denies any leaking of fluid.   The following portions of the patient's history were reviewed and updated as appropriate: allergies, current medications, past family history, past medical history, past social history, past surgical history and problem list.   Objective:   Vitals:   09/01/20 0830  BP: 117/69    Fetal Status:           General:  Alert, oriented and cooperative. Patient is in no acute distress.  Respiratory: Normal respiratory effort, no problems with respiration noted  Mental Status: Normal mood and affect. Normal behavior. Normal judgment and thought content.  Rest of physical exam deferred due to type of encounter  Imaging: No results found.  Assessment and Plan:  Pregnancy: G4P1011 at [redacted]w[redacted]d 1. Chlamydia infection affecting pregnancy, antepartum - took Azithro and partner was treated  2.  Supervision of other normal pregnancy, antepartum - anatomy US in 4 weeks - ROB in 6 weeks  3. History of pre-eclampsia - hasn't started bASA; instructed to pick up Rx and start  4. [redacted] weeks gestation  Preterm labor symptoms and general obstetric precautions including but not limited to vaginal bleeding, contractions, leaking of fluid and fetal movement were reviewed in detail with the patient. I discussed the assessment and treatment plan with the patient. The patient was provided an opportunity to ask questions and all were answered. The patient agreed with the plan and demonstrated an understanding of the instructions. The patient was advised to call back or seek an in-person office evaluation/go to MAU at [facility] for any urgent or concerning symptoms. Please refer to After Visit Summary for other counseling recommendations.   I provided 7 minutes of face-to-face time during this encounter.  Return in about 6 weeks (around 10/13/2020).  No future appointments.  Donette Larry, CNM Center for [employer], [facility]

## 2020-10-06 ENCOUNTER — Other Ambulatory Visit: Payer: Self-pay | Admitting: *Deleted

## 2020-10-06 ENCOUNTER — Encounter: Payer: Self-pay | Admitting: *Deleted

## 2020-10-06 ENCOUNTER — Other Ambulatory Visit: Payer: Self-pay

## 2020-10-06 ENCOUNTER — Ambulatory Visit: Payer: Medicaid Other | Attending: Certified Nurse Midwife

## 2020-10-06 ENCOUNTER — Ambulatory Visit: Payer: Medicaid Other | Admitting: *Deleted

## 2020-10-06 DIAGNOSIS — Z8759 Personal history of other complications of pregnancy, childbirth and the puerperium: Secondary | ICD-10-CM | POA: Diagnosis present

## 2020-10-06 DIAGNOSIS — Z2839 Other underimmunization status: Secondary | ICD-10-CM

## 2020-10-06 DIAGNOSIS — O09899 Supervision of other high risk pregnancies, unspecified trimester: Secondary | ICD-10-CM

## 2020-10-06 DIAGNOSIS — Z3480 Encounter for supervision of other normal pregnancy, unspecified trimester: Secondary | ICD-10-CM | POA: Insufficient documentation

## 2020-10-06 DIAGNOSIS — Z362 Encounter for other antenatal screening follow-up: Secondary | ICD-10-CM

## 2020-10-13 ENCOUNTER — Ambulatory Visit (INDEPENDENT_AMBULATORY_CARE_PROVIDER_SITE_OTHER): Payer: Medicaid Other | Admitting: Certified Nurse Midwife

## 2020-10-13 ENCOUNTER — Other Ambulatory Visit: Payer: Self-pay

## 2020-10-13 ENCOUNTER — Other Ambulatory Visit (HOSPITAL_COMMUNITY)
Admission: RE | Admit: 2020-10-13 | Discharge: 2020-10-13 | Disposition: A | Discharge status: Home / Self Care | Payer: Medicaid Other | Source: Ambulatory Visit | Attending: Certified Nurse Midwife | Admitting: Certified Nurse Midwife

## 2020-10-13 VITALS — BP 135/72 | HR 84 | Wt 179.0 lb

## 2020-10-13 DIAGNOSIS — Z8619 Personal history of other infectious and parasitic diseases: Secondary | ICD-10-CM | POA: Diagnosis present

## 2020-10-13 DIAGNOSIS — O98812 Other maternal infectious and parasitic diseases complicating pregnancy, second trimester: Secondary | ICD-10-CM

## 2020-10-13 DIAGNOSIS — A749 Chlamydial infection, unspecified: Secondary | ICD-10-CM

## 2020-10-13 DIAGNOSIS — Z3480 Encounter for supervision of other normal pregnancy, unspecified trimester: Secondary | ICD-10-CM

## 2020-10-13 NOTE — Progress Notes (Signed)
Subjective:  Lindsey Bennett is a 20 y.o. [phone] at [redacted]w[redacted]d being seen today for ongoing prenatal care.  She is currently monitored for the following issues for this low-risk pregnancy and has BMI 32.0-32.9,adult; Supervision of other normal pregnancy, antepartum; History of pre-eclampsia; Rubella non-immune status, antepartum; and Chlamydia infection affecting pregnancy on their problem list.  Patient reports no complaints.  Contractions: Not present. Vag. Bleeding: None.  Movement: Present. Denies leaking of fluid.   The following portions of the patient's history were reviewed and updated as appropriate: allergies, current medications, past family history, past medical history, past social history, past surgical history and problem list. Problem list updated.  Objective:   Vitals:   10/13/20 0936  BP: 135/72  Pulse: 84  Weight: 179 lb (81.2 kg)    Fetal Status: Fetal Heart Rate (bpm): 145 Fundal Height: 20 cm Movement: Present     General:  Alert, oriented and cooperative. Patient is in no acute distress.  Skin: Skin is warm and dry. No rash noted.   Cardiovascular: Normal heart rate noted  Respiratory: Normal respiratory effort, no problems with respiration noted  Abdomen: Soft, gravid, appropriate for gestational age. Pain/Pressure: Absent     Pelvic: Vag. Bleeding: None Vag D/C Character: Thin   Cervical exam deferred        Extremities: Normal range of motion.  Edema: None  Mental Status: Normal mood and affect. Normal behavior. Normal judgment and thought content.   Urinalysis:      Assessment and Plan:  Pregnancy: G4P1021 at [redacted]w[redacted]d  1. Supervision of other normal pregnancy, antepartum - normal anatomy US  2. Chlamydia infection affecting pregnancy in second trimester - Cervicovaginal ancillary only( [place])  Preterm labor symptoms and general obstetric precautions including but not limited to vaginal bleeding, contractions, leaking of fluid and fetal movement were  reviewed in detail with the patient. Please refer to After Visit Summary for other counseling recommendations.  Return in about 4 weeks (around 11/10/2020).   Donette Larry, CNM

## 2020-10-13 NOTE — Progress Notes (Signed)
Pt did self swab for TOC for Chlamydia

## 2020-10-16 LAB — CERVICOVAGINAL ANCILLARY ONLY
Chlamydia: NEGATIVE
Comment: NEGATIVE
Comment: NORMAL
Neisseria Gonorrhea: NEGATIVE

## 2020-11-03 ENCOUNTER — Ambulatory Visit: Payer: Medicaid Other

## 2020-11-06 ENCOUNTER — Encounter: Payer: Self-pay | Admitting: *Deleted

## 2020-11-06 ENCOUNTER — Ambulatory Visit: Payer: Medicaid Other | Admitting: *Deleted

## 2020-11-06 ENCOUNTER — Other Ambulatory Visit: Payer: Self-pay | Admitting: *Deleted

## 2020-11-06 ENCOUNTER — Ambulatory Visit: Payer: Medicaid Other | Attending: Obstetrics and Gynecology

## 2020-11-06 ENCOUNTER — Other Ambulatory Visit: Payer: Self-pay

## 2020-11-06 DIAGNOSIS — Z3480 Encounter for supervision of other normal pregnancy, unspecified trimester: Secondary | ICD-10-CM | POA: Diagnosis present

## 2020-11-06 DIAGNOSIS — Z2839 Other underimmunization status: Secondary | ICD-10-CM | POA: Diagnosis present

## 2020-11-06 DIAGNOSIS — Z8759 Personal history of other complications of pregnancy, childbirth and the puerperium: Secondary | ICD-10-CM | POA: Diagnosis present

## 2020-11-06 DIAGNOSIS — E669 Obesity, unspecified: Secondary | ICD-10-CM | POA: Diagnosis not present

## 2020-11-06 DIAGNOSIS — O99212 Obesity complicating pregnancy, second trimester: Secondary | ICD-10-CM | POA: Diagnosis not present

## 2020-11-06 DIAGNOSIS — Z362 Encounter for other antenatal screening follow-up: Secondary | ICD-10-CM | POA: Diagnosis not present

## 2020-11-06 DIAGNOSIS — Z3A23 23 weeks gestation of pregnancy: Secondary | ICD-10-CM

## 2020-11-06 DIAGNOSIS — O09299 Supervision of pregnancy with other poor reproductive or obstetric history, unspecified trimester: Secondary | ICD-10-CM

## 2020-11-10 ENCOUNTER — Other Ambulatory Visit: Payer: Self-pay

## 2020-11-10 ENCOUNTER — Encounter: Payer: Self-pay | Admitting: Obstetrics and Gynecology

## 2020-11-10 ENCOUNTER — Ambulatory Visit (INDEPENDENT_AMBULATORY_CARE_PROVIDER_SITE_OTHER): Payer: Medicaid Other | Admitting: Obstetrics and Gynecology

## 2020-11-10 VITALS — BP 134/75 | HR 73 | Wt 184.0 lb

## 2020-11-10 DIAGNOSIS — Z3480 Encounter for supervision of other normal pregnancy, unspecified trimester: Secondary | ICD-10-CM

## 2020-11-10 DIAGNOSIS — Z8759 Personal history of other complications of pregnancy, childbirth and the puerperium: Secondary | ICD-10-CM

## 2020-11-10 NOTE — Progress Notes (Signed)
   PRENATAL VISIT NOTE  Subjective:  Lindsey Bennett is a 19 y.o. [phone] at [redacted]w[redacted]d being seen today for ongoing prenatal care.  She is currently monitored for the following issues for this low-risk pregnancy and has BMI 32.0-32.9,adult; Supervision of other normal pregnancy, antepartum; History of pre-eclampsia; Rubella non-immune status, antepartum; and Chlamydia infection affecting pregnancy on their problem list.  Patient reports no complaints.  Contractions: Not present. Vag. Bleeding: None.  Movement: Present. Denies leaking of fluid.   The following portions of the patient's history were reviewed and updated as appropriate: allergies, current medications, past family history, past medical history, past social history, past surgical history and problem list.   Objective:   Vitals:   11/10/20 1036  BP: 134/75  Pulse: 73  Weight: 184 lb (83.5 kg)    Fetal Status: Fetal Heart Rate (bpm): 146 Fundal Height: 25 cm Movement: Present     General:  Alert, oriented and cooperative. Patient is in no acute distress.  Skin: Skin is warm and dry. No rash noted.   Cardiovascular: Normal heart rate noted  Respiratory: Normal respiratory effort, no problems with respiration noted  Abdomen: Soft, gravid, appropriate for gestational age.  Pain/Pressure: Absent     Pelvic: Cervical exam deferred        Extremities: Normal range of motion.  Edema: None  Mental Status: Normal mood and affect. Normal behavior. Normal judgment and thought content.   Assessment and Plan:  Pregnancy: G4P1021 at [redacted]w[redacted]d  1. Supervision of other normal pregnancy, antepartum  Doing well Feeling regular movements 2 hour GTT next visit - NPO past MN.   2. History of pre-eclampsia  Continue daily BASA  BP good here   Preterm labor symptoms and general obstetric precautions including but not limited to vaginal bleeding, contractions, leaking of fluid and fetal movement were reviewed in detail with the  patient. Please refer to After Visit Summary for other counseling recommendations.   No follow-ups on file.  Future Appointments  Date Time Provider Department Center  12/08/2020  8:30 AM Donette Larry, CNM [facility]  01/09/2021  9:45 AM [facility] NURSE [facility]  01/09/2021 10:00 AM [facility] US1 [facility]    Venia Carbon, NP

## 2020-12-08 ENCOUNTER — Other Ambulatory Visit: Payer: Self-pay | Admitting: Certified Nurse Midwife

## 2020-12-08 ENCOUNTER — Other Ambulatory Visit: Payer: Self-pay

## 2020-12-08 ENCOUNTER — Ambulatory Visit (INDEPENDENT_AMBULATORY_CARE_PROVIDER_SITE_OTHER): Payer: Medicaid Other | Admitting: Certified Nurse Midwife

## 2020-12-08 VITALS — BP 133/73 | HR 85 | Wt 187.0 lb

## 2020-12-08 DIAGNOSIS — Z23 Encounter for immunization: Secondary | ICD-10-CM

## 2020-12-08 DIAGNOSIS — Z3482 Encounter for supervision of other normal pregnancy, second trimester: Secondary | ICD-10-CM

## 2020-12-08 DIAGNOSIS — Z3A28 28 weeks gestation of pregnancy: Secondary | ICD-10-CM

## 2020-12-08 DIAGNOSIS — Z3480 Encounter for supervision of other normal pregnancy, unspecified trimester: Secondary | ICD-10-CM

## 2020-12-08 NOTE — Progress Notes (Addendum)
Subjective:  Lindsey Bennett is a 20 y.o. [phone] at [redacted]w[redacted]d being seen today for ongoing prenatal care.  She is currently monitored for the following issues for this low-risk pregnancy and has BMI 32.0-32.9,adult; Supervision of other normal pregnancy, antepartum; History of pre-eclampsia; Rubella non-immune status, antepartum; and Chlamydia infection affecting pregnancy on their problem list.  Patient reports no complaints.  Contractions: Not present. Vag. Bleeding: None.  Movement: Present. Denies leaking of fluid.   The following portions of the patient's history were reviewed and updated as appropriate: allergies, current medications, past family history, past medical history, past social history, past surgical history and problem list. Problem list updated.  Objective:   Vitals:   12/08/20 0827  BP: 133/73  Pulse: 85  Weight: 187 lb (84.8 kg)    Fetal Status: Fetal Heart Rate (bpm): 153 Fundal Height: 28 cm Movement: Present  Presentation: Undeterminable  General:  Alert, oriented and cooperative. Patient is in no acute distress.  Skin: Skin is warm and dry. No rash noted.   Cardiovascular: Normal heart rate noted  Respiratory: Normal respiratory effort, no problems with respiration noted  Abdomen: Soft, gravid, appropriate for gestational age. Pain/Pressure: Absent     Pelvic: Vag. Bleeding: None Vag D/C Character: Thin   Cervical exam deferred        Extremities: Normal range of motion.  Edema: None  Mental Status: Normal mood and affect. Normal behavior. Normal judgment and thought content.   Urinalysis:      Assessment and Plan:  Pregnancy: G4P1021 at [redacted]w[redacted]d  1. Supervision of other normal pregnancy, antepartum - 2Hr GTT w/ 1 Hr Carpenter 75 g - CBC - RPR - HIV antibody (with reflex)  2. [redacted] weeks gestation   Preterm labor symptoms and general obstetric precautions including but not limited to vaginal bleeding, contractions, leaking of fluid and fetal movement were  reviewed in detail with the patient. Please refer to After Visit Summary for other counseling recommendations.  Return in about 2 weeks (around 12/22/2020).   Donette Larry, CNM

## 2020-12-08 NOTE — Patient Instructions (Signed)

## 2020-12-11 LAB — CBC
HCT: 38.7 % (ref 35.0–45.0)
Hemoglobin: 12.3 g/dL (ref 11.7–15.5)
MCH: 26.8 pg — ABNORMAL LOW (ref 27.0–33.0)
MCHC: 31.8 g/dL — ABNORMAL LOW (ref 32.0–36.0)
MCV: 84.3 fL (ref 80.0–100.0)
MPV: 9.8 fL (ref 7.5–12.5)
Platelets: 336 10*3/uL (ref 140–400)
RBC: 4.59 10*6/uL (ref 3.80–5.10)
RDW: 12.4 % (ref 11.0–15.0)
WBC: 11.3 10*3/uL — ABNORMAL HIGH (ref 3.8–10.8)

## 2020-12-11 LAB — 2HR GTT W 1 HR, CARPENTER, 75 G
Glucose, 1 Hr, Gest: 140 mg/dL (ref 65–179)
Glucose, 2 Hr, Gest: 132 mg/dL (ref 65–152)
Glucose, Fasting, Gest: 76 mg/dL (ref 65–91)

## 2020-12-11 LAB — HIV ANTIBODY (ROUTINE TESTING W REFLEX): HIV 1&2 Ab, 4th Generation: NONREACTIVE

## 2020-12-11 LAB — RPR: RPR Ser Ql: NONREACTIVE

## 2020-12-22 ENCOUNTER — Inpatient Hospital Stay (HOSPITAL_COMMUNITY): Payer: Medicaid Other

## 2020-12-22 ENCOUNTER — Encounter (HOSPITAL_COMMUNITY): Payer: Self-pay | Admitting: Obstetrics and Gynecology

## 2020-12-22 ENCOUNTER — Telehealth (INDEPENDENT_AMBULATORY_CARE_PROVIDER_SITE_OTHER): Payer: Medicaid Other | Admitting: Obstetrics and Gynecology

## 2020-12-22 ENCOUNTER — Inpatient Hospital Stay (HOSPITAL_COMMUNITY)
Admission: AD | Admit: 2020-12-22 | Discharge: 2020-12-22 | Disposition: A | Discharge status: Home / Self Care | Payer: Medicaid Other | Attending: Obstetrics and Gynecology | Admitting: Obstetrics and Gynecology

## 2020-12-22 ENCOUNTER — Other Ambulatory Visit: Payer: Self-pay

## 2020-12-22 VITALS — BP 118/74

## 2020-12-22 DIAGNOSIS — Z3A30 30 weeks gestation of pregnancy: Secondary | ICD-10-CM | POA: Diagnosis not present

## 2020-12-22 DIAGNOSIS — Z3480 Encounter for supervision of other normal pregnancy, unspecified trimester: Secondary | ICD-10-CM

## 2020-12-22 DIAGNOSIS — O26893 Other specified pregnancy related conditions, third trimester: Secondary | ICD-10-CM | POA: Insufficient documentation

## 2020-12-22 DIAGNOSIS — O99891 Other specified diseases and conditions complicating pregnancy: Secondary | ICD-10-CM

## 2020-12-22 DIAGNOSIS — O368130 Decreased fetal movements, third trimester, not applicable or unspecified: Secondary | ICD-10-CM | POA: Insufficient documentation

## 2020-12-22 DIAGNOSIS — R1011 Right upper quadrant pain: Secondary | ICD-10-CM | POA: Diagnosis not present

## 2020-12-22 DIAGNOSIS — Z79899 Other long term (current) drug therapy: Secondary | ICD-10-CM | POA: Insufficient documentation

## 2020-12-22 DIAGNOSIS — O09899 Supervision of other high risk pregnancies, unspecified trimester: Secondary | ICD-10-CM

## 2020-12-22 DIAGNOSIS — O1203 Gestational edema, third trimester: Secondary | ICD-10-CM

## 2020-12-22 DIAGNOSIS — Z8759 Personal history of other complications of pregnancy, childbirth and the puerperium: Secondary | ICD-10-CM

## 2020-12-22 DIAGNOSIS — A749 Chlamydial infection, unspecified: Secondary | ICD-10-CM

## 2020-12-22 LAB — COMPREHENSIVE METABOLIC PANEL
ALT: 13 U/L (ref 0–44)
AST: 18 U/L (ref 15–41)
Albumin: 3.1 g/dL — ABNORMAL LOW (ref 3.5–5.0)
Alkaline Phosphatase: 92 U/L (ref 38–126)
Anion gap: 9 (ref 5–15)
BUN: <5 mg/dL — ABNORMAL LOW (ref 6–20)
CO2: 23 mmol/L (ref 22–32)
Calcium: 9.4 mg/dL (ref 8.9–10.3)
Chloride: 103 mmol/L (ref 98–111)
Creatinine, Ser: 0.49 mg/dL (ref 0.44–1.00)
GFR, Estimated: >60 mL/min (ref >60)
Glucose, Bld: 79 mg/dL (ref 70–99)
Potassium: 3.7 mmol/L (ref 3.5–5.1)
Sodium: 135 mmol/L (ref 135–145)
Total Bilirubin: 0.4 mg/dL (ref 0.3–1.2)
Total Protein: 6.8 g/dL (ref 6.5–8.1)

## 2020-12-22 LAB — CBC WITH DIFFERENTIAL/PLATELET
Abs Immature Granulocytes: 0.13 10*3/uL — ABNORMAL HIGH (ref 0.00–0.07)
Basophils Absolute: 0 10*3/uL (ref 0.0–0.1)
Basophils Relative: 0 %
Eosinophils Absolute: 0.2 10*3/uL (ref 0.0–0.5)
Eosinophils Relative: 2 %
HCT: 37.8 % (ref 36.0–46.0)
Hemoglobin: 12.5 g/dL (ref 12.0–15.0)
Immature Granulocytes: 1 %
Lymphocytes Relative: 22 %
Lymphs Abs: 2.5 10*3/uL (ref 0.7–4.0)
MCH: 27.7 pg (ref 26.0–34.0)
MCHC: 33.1 g/dL (ref 30.0–36.0)
MCV: 83.6 fL (ref 80.0–100.0)
Monocytes Absolute: 0.7 10*3/uL (ref 0.1–1.0)
Monocytes Relative: 6 %
Neutro Abs: 8.1 10*3/uL — ABNORMAL HIGH (ref 1.7–7.7)
Neutrophils Relative %: 69 %
Platelets: 315 10*3/uL (ref 150–400)
RBC: 4.52 MIL/uL (ref 3.87–5.11)
RDW: 12.6 % (ref 11.5–15.5)
WBC: 11.7 10*3/uL — ABNORMAL HIGH (ref 4.0–10.5)
nRBC: 0 % (ref 0.0–0.2)

## 2020-12-22 LAB — URINALYSIS, ROUTINE W REFLEX MICROSCOPIC
Bilirubin Urine: NEGATIVE
Glucose, UA: NEGATIVE mg/dL
Hgb urine dipstick: NEGATIVE
Ketones, ur: NEGATIVE mg/dL
Leukocytes,Ua: NEGATIVE
Nitrite: NEGATIVE
Protein, ur: NEGATIVE mg/dL
Specific Gravity, Urine: 1.011 (ref 1.005–1.030)
pH: 9 — ABNORMAL HIGH (ref 5.0–8.0)

## 2020-12-22 LAB — PROTEIN / CREATININE RATIO, URINE
Creatinine, Urine: 65.39 mg/dL
Protein Creatinine Ratio: 0.14 mg/mg{Cre} (ref 0.00–0.15)
Total Protein, Urine: 9 mg/dL

## 2020-12-22 MED ORDER — FAMOTIDINE 20 MG PO TABS: 20.0000 mg | ORAL_TABLET | Freq: Two times a day (BID) | ORAL | 1 refills | Status: DC

## 2020-12-22 NOTE — MAU Note (Signed)
Lindsey Bennett is a 20 y.o. at [redacted]w[redacted]d here in MAU reporting: RUQ that happened around 0100, 0900, and 1130. She is currently not having RUQ pain but is having RLQ pain. No vaginal bleeding, LOF, or discharge. DFM.  Onset of complaint: today  Pain score: 2/10  Vitals:   12/22/20 1335  BP: (!) 141/72  Pulse: 83  Resp: 16  Temp: 98.8 F (37.1 C)  SpO2: 96%     FHT:160  Lab orders placed from triage: UA

## 2020-12-22 NOTE — Patient Instructions (Signed)

## 2020-12-22 NOTE — Progress Notes (Signed)
    TELEHEALTH OBSTETRICS VISIT ENCOUNTER NOTE  Provider location: Center for [facility] at [place]   Patient location: Home  I connected with Lindsey Bennett on 12/22/20 at 10:10 AM EDT by telephone at home and verified that I am speaking with the correct person using two identifiers.  Patient is at home, provider is in the office.     I discussed the limitations, risks, security and privacy concerns of performing an evaluation and management service by telephone and the availability of in person appointments. I also discussed with the patient that there may be a patient responsible charge related to this service. The patient expressed understanding and agreed to proceed.  Subjective:  Lindsey Bennett is a 20 y.o. [phone] at [redacted]w[redacted]d being followed for ongoing prenatal care.  She is currently monitored for the following issues for this low-risk pregnancy and has BMI 32.0-32.9,adult; Supervision of other normal pregnancy, antepartum; History of pre-eclampsia; Rubella non-immune status, antepartum; and Chlamydia infection affecting pregnancy on their problem list.  Patient reports  RUQ pain . Reports fetal movement. Denies any contractions, bleeding or leaking of fluid. The first time she started feeling the pain was 2 weeks ago.  She ate waffle house at 10 pm; eggs, toast and hash browns, around 0100 she started having the RUQ pain again.   The following portions of the patient's history were reviewed and updated as appropriate: allergies, current medications, past family history, past medical history, past social history, past surgical history and problem list.   Objective:  Last menstrual period 05/26/2020, unknown if currently breastfeeding. General:  Alert, oriented and cooperative.   Mental Status: Normal mood and affect perceived. Normal judgment and thought content.  Rest of physical exam deferred due to type of encounter  Assessment and Plan:  Pregnancy: G4P1021 at  [redacted]w[redacted]d 1. Supervision of other normal pregnancy, antepartum  BP: 118/74 Mild swelling in feet. No HA's.  Right upper quadrant pain that started shortly after eating waffle house. May be gall bladder. She has no vomiting. Rx: Pepcid. If symptoms worsen she should go to MAU for evaluation. Avoid all fried foods.    Preterm labor symptoms and general obstetric precautions including but not limited to vaginal bleeding, contractions, leaking of fluid and fetal movement were reviewed in detail with the patient.  I discussed the assessment and treatment plan with the patient. The patient was provided an opportunity to ask questions and all were answered. The patient agreed with the plan and demonstrated an understanding of the instructions. The patient was advised to call back or seek an in-person office evaluation/go to MAU at [facility] for any urgent or concerning symptoms. Please refer to After Visit Summary for other counseling recommendations.   I provided 10 minutes of non-face-to-face time during this encounter.  No follow-ups on file.  Future Appointments  Date Time Provider Department Center  01/09/2021  9:45 AM [facility] NURSE [facility]  01/09/2021 10:00 AM [facility] US1 [facility]    Venia Carbon, NP Center for [employer], [facility]

## 2020-12-22 NOTE — MAU Provider Note (Signed)
History     CSN: [id number]  Arrival date and time: 12/22/20 1317   Event Date/Time   First Provider Initiated Contact with Patient 12/22/20 1403      Chief Complaint  Patient presents with   Abdominal Pain   Decreased Fetal Movement   HPI  [name] is a 20 y.o. female [phone] @ [phone] here in MAU with complaints of RUQ pain. She first felt the pain 2 weeks ago and then never felt it again. She reports feeling the pain at 0100 last night, which woke her up from her sleep. The last time she ate was 10 pm last night and she ate [employer]. She reports decreased appetite, and no N/V.   OB History     Gravida  4   Para  1   Term  1   Preterm  0   AB  2   Living  1      SAB  2   IAB  0   Ectopic  0   Multiple  0   Live Births  1           Past Medical History:  Diagnosis Date   Medical history non-contributory     Past Surgical History:  Procedure Laterality Date   NO PAST SURGERIES     WISDOM TOOTH EXTRACTION Bilateral 2017    Family History  Problem Relation Age of Onset   Obesity Neg Hx    Hypertension Neg Hx    Heart disease Neg Hx    Diabetes Neg Hx     Social History   Tobacco Use   Smoking status: Never   Smokeless tobacco: Never  Vaping Use   Vaping Use: Never used  Substance Use Topics   Alcohol use: No   Drug use: No    Allergies: No Known Allergies  Medications Prior to Admission  Medication Sig Dispense Refill Last Dose   aspirin 81 MG chewable tablet Chew 1 tablet (81 mg total) by mouth daily. Start at 13 weeks 60 tablet 3 12/21/2020   Prenatal Vit-Fe Fumarate-FA (PRENATAL MULTIVITAMIN) TABS tablet Take 1 tablet by mouth daily at 12 noon.   12/21/2020   famotidine (PEPCID) 20 MG tablet Take 1 tablet (20 mg total) by mouth 2 (two) times daily. 60 tablet 1    Results for orders placed or performed during the hospital encounter of 12/22/20 (from the past 48 hour(s))  Urinalysis, Routine w reflex microscopic  Urine, Clean Catch     Status: Abnormal   Collection Time: 12/22/20  1:22 PM  Result Value Ref Range   Color, Urine YELLOW YELLOW   APPearance CLOUDY (A) CLEAR   Specific Gravity, Urine 1.011 1.005 - 1.030   pH 9.0 (H) 5.0 - 8.0   Glucose, UA NEGATIVE NEGATIVE mg/dL   Hgb urine dipstick NEGATIVE NEGATIVE   Bilirubin Urine NEGATIVE NEGATIVE   Ketones, ur NEGATIVE NEGATIVE mg/dL   Protein, ur NEGATIVE NEGATIVE mg/dL   Nitrite NEGATIVE NEGATIVE   Leukocytes,Ua NEGATIVE NEGATIVE    Comment: Performed at [facility], [address], [place], [place] [address]  CBC with Differential/Platelet     Status: Abnormal   Collection Time: 12/22/20  1:51 PM  Result Value Ref Range   WBC 11.7 (H) 4.0 - 10.5 K/uL   RBC 4.52 3.87 - 5.11 MIL/uL   Hemoglobin 12.5 12.0 - 15.0 g/dL   HCT 26.9 48.5 - 46.2 %   MCV 83.6 80.0 - 100.0 fL  MCH 27.7 26.0 - 34.0 pg   MCHC 33.1 30.0 - 36.0 g/dL   RDW 78.4 69.6 - 29.5 %   Platelets 315 150 - 400 K/uL   nRBC 0.0 0.0 - 0.2 %   Neutrophils Relative % 69 %   Neutro Abs 8.1 (H) 1.7 - 7.7 K/uL   Lymphocytes Relative 22 %   Lymphs Abs 2.5 0.7 - 4.0 K/uL   Monocytes Relative 6 %   Monocytes Absolute 0.7 0.1 - 1.0 K/uL   Eosinophils Relative 2 %   Eosinophils Absolute 0.2 0.0 - 0.5 K/uL   Basophils Relative 0 %   Basophils Absolute 0.0 0.0 - 0.1 K/uL   Immature Granulocytes 1 %   Abs Immature Granulocytes 0.13 (H) 0.00 - 0.07 K/uL    Comment: Performed at [facility], [address] [place], [place] [address]  Comprehensive metabolic panel     Status: Abnormal   Collection Time: 12/22/20  1:51 PM  Result Value Ref Range   Sodium 135 135 - 145 mmol/L   Potassium 3.7 3.5 - 5.1 mmol/L   Chloride 103 98 - 111 mmol/L   CO2 23 22 - 32 mmol/L   Glucose, Bld 79 70 - 99 mg/dL    Comment: Glucose reference range applies only to samples taken after fasting for at least 8 hours.   BUN <5 (L) 6 - 20 mg/dL   Creatinine, Ser 2.44 0.44 - 1.00 mg/dL    Calcium 9.4 8.9 - 01.0 mg/dL   Total Protein 6.8 6.5 - 8.1 g/dL   Albumin 3.1 (L) 3.5 - 5.0 g/dL   AST 18 15 - 41 U/L   ALT 13 0 - 44 U/L   Alkaline Phosphatase 92 38 - 126 U/L   Total Bilirubin 0.4 0.3 - 1.2 mg/dL   GFR, Estimated >27 >25 mL/min    Comment: (NOTE) Calculated using the CKD-EPI Creatinine Equation (2021)    Anion gap 9 5 - 15    Comment: Performed at [facility], [address] [place], [place] [address]  Protein / creatinine ratio, urine     Status: None   Collection Time: 12/22/20  2:06 PM  Result Value Ref Range   Creatinine, Urine 65.39 mg/dL   Total Protein, Urine 9 mg/dL    Comment: NO NORMAL RANGE ESTABLISHED FOR THIS TEST   Protein Creatinine Ratio 0.14 0.00 - 0.15 mg/mg[Cre]    Comment: Performed at [facility], [address] [place], [place] [address]    US ABDOMEN LIMITED RUQ (LIVER/GB)  Result Date: 12/22/2020 CLINICAL DATA:  Right upper quadrant pain. EXAM: ULTRASOUND ABDOMEN LIMITED RIGHT UPPER QUADRANT COMPARISON:  None. FINDINGS: Gallbladder: No gallstones or gallbladder wall thickening. No pericholecystic fluid. The sonographer reports no sonographic Murphy's sign. Common bile duct: Diameter: 2 mm Liver: No focal lesion identified. Within normal limits in parenchymal echogenicity. Portal vein is patent on color Doppler imaging with normal direction of blood flow towards the liver. Other: None. IMPRESSION: Unremarkable right upper quadrant ultrasound. No findings to explain the patient's history of pain. Electronically Signed   By: Kennith Center M.D.   On: 12/22/2020 14:54     Review of Systems  Gastrointestinal:  Positive for abdominal pain. Negative for nausea and vomiting.  Physical Exam   Blood pressure 127/65, pulse 92, temperature 98.8 F (37.1 C), temperature source Oral, resp. rate 16, height 5\' 1"  (1.549 m), weight 88.3 kg, last menstrual period 05/26/2020, SpO2 96 %, unknown  if currently breastfeeding.  Patient Vitals for the  past 24 hrs:  BP Temp Temp src Pulse Resp SpO2 Height Weight  12/22/20 1519 (!) 110/58 -- -- 95 -- 97 % -- --  12/22/20 1403 127/65 -- -- 92 -- -- -- --  12/22/20 1335 (!) 141/72 98.8 F (37.1 C) Oral 83 16 96 % -- --  12/22/20 1329 -- -- -- -- -- -- 5\' 1"  (1.549 m) 88.3 kg     Physical Exam Constitutional:      General: She is not in acute distress.    Appearance: She is well-developed. She is not ill-appearing, toxic-appearing or diaphoretic.  HENT:     Head: Normocephalic.  Abdominal:     Tenderness: There is generalized abdominal tenderness. There is no guarding or rebound. Negative signs include Murphy's sign.  Skin:    General: Skin is warm.  Neurological:     Mental Status: She is alert and oriented to person, place, and time.  Psychiatric:        Mood and Affect: Mood normal.   Fetal Tracing: Baseline: 145 bpm  Variability: Moderate  Accelerations: 15x15 Decelerations: None Toco:  None  MAU Course  Procedures  MDM  Isolated elevated BP PIH labs for baseline- normal RUQ normal 0/10 pain at the time of discharge Reviewed [place] in detail with the patient.   Assessment and Plan   A:  RUQ pain - Plan: US ABDOMEN LIMITED RUQ (LIVER/GB), US ABDOMEN LIMITED RUQ (LIVER/GB), Discharge patient  [redacted] weeks gestation of pregnancy - Plan: Discharge patient   P:  Discharge home in stable condition Avoid fatty/ greasy foods Rx: Pepcid, sent from the office today Return to MAU with worsening symptoms ie. Pain/ vomiting   Allysa Governale, [place], NP 12/22/2020 3:41 PM

## 2020-12-25 ENCOUNTER — Inpatient Hospital Stay (HOSPITAL_COMMUNITY)
Admission: AD | Admit: 2020-12-25 | Discharge: 2020-12-25 | Disposition: A | Discharge status: Home / Self Care | Payer: Medicaid Other | Attending: Obstetrics & Gynecology | Admitting: Obstetrics & Gynecology

## 2020-12-25 ENCOUNTER — Other Ambulatory Visit: Payer: Self-pay

## 2020-12-25 ENCOUNTER — Encounter (HOSPITAL_COMMUNITY): Payer: Self-pay | Admitting: Obstetrics & Gynecology

## 2020-12-25 DIAGNOSIS — N949 Unspecified condition associated with female genital organs and menstrual cycle: Secondary | ICD-10-CM

## 2020-12-25 DIAGNOSIS — N9489 Other specified conditions associated with female genital organs and menstrual cycle: Secondary | ICD-10-CM | POA: Diagnosis not present

## 2020-12-25 DIAGNOSIS — O26893 Other specified pregnancy related conditions, third trimester: Secondary | ICD-10-CM | POA: Diagnosis not present

## 2020-12-25 DIAGNOSIS — Z2839 Other underimmunization status: Secondary | ICD-10-CM | POA: Insufficient documentation

## 2020-12-25 DIAGNOSIS — R102 Pelvic and perineal pain: Secondary | ICD-10-CM | POA: Insufficient documentation

## 2020-12-25 DIAGNOSIS — O09899 Supervision of other high risk pregnancies, unspecified trimester: Secondary | ICD-10-CM

## 2020-12-25 DIAGNOSIS — Z3480 Encounter for supervision of other normal pregnancy, unspecified trimester: Secondary | ICD-10-CM

## 2020-12-25 DIAGNOSIS — Z7982 Long term (current) use of aspirin: Secondary | ICD-10-CM | POA: Insufficient documentation

## 2020-12-25 DIAGNOSIS — Z3A30 30 weeks gestation of pregnancy: Secondary | ICD-10-CM | POA: Diagnosis not present

## 2020-12-25 DIAGNOSIS — R300 Dysuria: Secondary | ICD-10-CM | POA: Insufficient documentation

## 2020-12-25 DIAGNOSIS — Z8759 Personal history of other complications of pregnancy, childbirth and the puerperium: Secondary | ICD-10-CM | POA: Insufficient documentation

## 2020-12-25 DIAGNOSIS — R1031 Right lower quadrant pain: Secondary | ICD-10-CM | POA: Diagnosis not present

## 2020-12-25 LAB — URINALYSIS, ROUTINE W REFLEX MICROSCOPIC
Bacteria, UA: NONE SEEN
Bilirubin Urine: NEGATIVE
Glucose, UA: NEGATIVE mg/dL
Hgb urine dipstick: NEGATIVE
Ketones, ur: NEGATIVE mg/dL
Leukocytes,Ua: NEGATIVE
Nitrite: NEGATIVE
Protein, ur: NEGATIVE mg/dL
Specific Gravity, Urine: 1.015 (ref 1.005–1.030)
pH: 7 (ref 5.0–8.0)

## 2020-12-25 NOTE — MAU Provider Note (Signed)
Patient Emmer Lillibridge is a 20 y.o. [phone]  At [redacted]w[redacted]d here with complaints of RLQ that started at 11 am today. It went away at 12. Came back at 4 pm, lasted an hour. Then it came back at 9 lasted for hour, then came back at 10:30 and felt worse.  She denies vaginal bleeding, vaginal discharge, no dysuria (although when "I have the abdominal pain it hurts when I urinate").  No contractions. She reports strong fetal movements.   She has no history of pre-term labor. Pregnancy history is complicated by history of pre-e. She had NSVD in 2019 after IOL for PEC.   She takes pepcid and baby ASA.   In the morning she had a sausage egg mcmuffin, lunch she had rice and pizza, dinner she had rice, meat and vegetables.  History     CSN: [id number]  Arrival date and time: 12/25/20 0045   Event Date/Time   First Provider Initiated Contact with Patient 12/25/20 0128      Chief Complaint  Patient presents with   Abdominal Pain   Emesis   Nausea   Dysuria   Abdominal Pain This is a new problem. The current episode started today. The problem has been gradually worsening since onset. The pain is located in the RLQ. The pain is at a severity of 3/10. The quality of the pain is described as sharp. The pain does not radiate. Associated symptoms include dysuria and vomiting. Nothing relieves the symptoms.  Emesis  This is a new problem. The current episode started today. The problem occurs less than 2 times per day. The problem has been resolved. There has been no fever. Associated symptoms include abdominal pain.  Dysuria  Episode frequency: reports that she just feels uncomfortable when she pees because of the RLQ pain. Associated symptoms include vomiting.   OB History     Gravida  4   Para  1   Term  1   Preterm  0   AB  2   Living  1      SAB  2   IAB  0   Ectopic  0   Multiple  0   Live Births  1           Past Medical History:  Diagnosis Date   Medical history  non-contributory     Past Surgical History:  Procedure Laterality Date   NO PAST SURGERIES     WISDOM TOOTH EXTRACTION Bilateral 2017    Family History  Problem Relation Age of Onset   Obesity Neg Hx    Hypertension Neg Hx    Heart disease Neg Hx    Diabetes Neg Hx     Social History   Tobacco Use   Smoking status: Never   Smokeless tobacco: Never  Vaping Use   Vaping Use: Never used  Substance Use Topics   Alcohol use: No   Drug use: No    Allergies: No Known Allergies  Medications Prior to Admission  Medication Sig Dispense Refill Last Dose   aspirin 81 MG chewable tablet Chew 1 tablet (81 mg total) by mouth daily. Start at 13 weeks 60 tablet 3 12/24/2020   famotidine (PEPCID) 20 MG tablet Take 1 tablet (20 mg total) by mouth 2 (two) times daily. 60 tablet 1 12/24/2020   Prenatal Vit-Fe Fumarate-FA (PRENATAL MULTIVITAMIN) TABS tablet Take 1 tablet by mouth daily at 12 noon.   12/24/2020    Review of Systems  Gastrointestinal:  Positive  for abdominal pain and vomiting.  Genitourinary:  Positive for dysuria.  Physical Exam   Blood pressure 130/73, pulse 77, temperature 98.6 F (37 C), temperature source Oral, resp. rate 15, last menstrual period 05/26/2020, SpO2 99 %, unknown if currently breastfeeding.  Physical Exam Constitutional:      Appearance: She is well-developed.  Abdominal:     General: Abdomen is flat. Bowel sounds are normal.     Palpations: Abdomen is soft.     Tenderness: There is no abdominal tenderness.  Neurological:     Mental Status: She is alert.    MAU Course  Procedures  MDM nST: 135 bpm, mod var, present acel, no decels, no contractions -UA is clear for signs of infection -declined vaginal swabs and cultures, declined cervical exam. She states "I just wanted to make sure that everything is ok".  -vital signs stable in MAU, low index of suspicion for pre-e with normal BP and denial of HA, blurry vision, floating spots, RUQ pain.   -Patient Vitals for the past 24 hrs:  BP Temp Temp src Pulse Resp SpO2  12/25/20 0100 130/73 98.6 F (37 C) Oral 77 15 99 %    Assessment and Plan   1. Rubella non-immune status, antepartum   2. Supervision of other normal pregnancy, antepartum   3. History of pre-eclampsia   4. Round ligament pain    -reviewed diet with patient, encouraged increase in fruit vegetales, walking, water immersion. Explained that her diet may be contributing to her abdominal pain and episode of vomiting this evening.  Explained that urine is negative for signs of infection, low index of suspicion for UTI.   Explained normalcy of RLP, explained physiology of pregnancy and why RLP happens.  -keep follow up appt in two weeks  Charlesetta Garibaldi Zeno Hickel 12/25/2020, 1:33 AM

## 2020-12-25 NOTE — MAU Note (Signed)
Pt reports right lower abd pain off and on all day, worsening tonight. Nausea and vomiting on her way to the hospital. Denies vaginal bleeding.

## 2021-01-03 ENCOUNTER — Ambulatory Visit (INDEPENDENT_AMBULATORY_CARE_PROVIDER_SITE_OTHER): Payer: Medicaid Other | Admitting: Certified Nurse Midwife

## 2021-01-03 ENCOUNTER — Other Ambulatory Visit: Payer: Self-pay

## 2021-01-03 VITALS — BP 135/79 | HR 108 | Wt 196.0 lb

## 2021-01-03 DIAGNOSIS — Z3A31 31 weeks gestation of pregnancy: Secondary | ICD-10-CM

## 2021-01-03 DIAGNOSIS — Z3480 Encounter for supervision of other normal pregnancy, unspecified trimester: Secondary | ICD-10-CM

## 2021-01-03 NOTE — Progress Notes (Signed)
Subjective:  Lindsey Bennett is a 20 y.o. [phone] at [redacted]w[redacted]d being seen today for ongoing prenatal care.  She is currently monitored for the following issues for this low-risk pregnancy and has BMI 32.0-32.9,adult; Supervision of other normal pregnancy, antepartum; History of pre-eclampsia; Rubella non-immune status, antepartum; and Chlamydia infection affecting pregnancy on their problem list.  Patient reports no complaints.  Contractions: Not present. Vag. Bleeding: None.  Movement: Present. Denies leaking of fluid.   The following portions of the patient's history were reviewed and updated as appropriate: allergies, current medications, past family history, past medical history, past social history, past surgical history and problem list. Problem list updated.  Objective:   Vitals:   01/03/21 0935  BP: 135/79  Pulse: (!) 108  Weight: 196 lb (88.9 kg)    Fetal Status: Fetal Heart Rate (bpm): 145 Fundal Height: 32 cm Movement: Present     General:  Alert, oriented and cooperative. Patient is in no acute distress.  Skin: Skin is warm and dry. No rash noted.   Cardiovascular: Normal heart rate noted  Respiratory: Normal respiratory effort, no problems with respiration noted  Abdomen: Soft, gravid, appropriate for gestational age. Pain/Pressure: Absent     Pelvic: Vag. Bleeding: None Vag D/C Character: Thin   Cervical exam deferred        Extremities: Normal range of motion.  Edema: None  Mental Status: Normal mood and affect. Normal behavior. Normal judgment and thought content.   Urinalysis:      Assessment and Plan:  Pregnancy: [id number] at [redacted]w[redacted]d  1. Supervision of other normal pregnancy, antepartum  2. [redacted] weeks gestation of pregnancy   Preterm labor symptoms and general obstetric precautions including but not limited to vaginal bleeding, contractions, leaking of fluid and fetal movement were reviewed in detail with the patient. Please refer to After Visit Summary for other  counseling recommendations.  Return in about 3 weeks (around 01/24/2021).   Donette Larry, CNM

## 2021-01-09 ENCOUNTER — Ambulatory Visit: Payer: Medicaid Other | Attending: Obstetrics and Gynecology

## 2021-01-09 ENCOUNTER — Ambulatory Visit: Payer: Medicaid Other | Admitting: *Deleted

## 2021-01-09 ENCOUNTER — Other Ambulatory Visit: Payer: Self-pay

## 2021-01-09 ENCOUNTER — Encounter: Payer: Self-pay | Admitting: *Deleted

## 2021-01-09 VITALS — BP 129/65 | HR 81

## 2021-01-09 DIAGNOSIS — Z8759 Personal history of other complications of pregnancy, childbirth and the puerperium: Secondary | ICD-10-CM | POA: Diagnosis present

## 2021-01-09 DIAGNOSIS — Z2839 Other underimmunization status: Secondary | ICD-10-CM | POA: Insufficient documentation

## 2021-01-09 DIAGNOSIS — Z3480 Encounter for supervision of other normal pregnancy, unspecified trimester: Secondary | ICD-10-CM | POA: Insufficient documentation

## 2021-01-09 DIAGNOSIS — O09899 Supervision of other high risk pregnancies, unspecified trimester: Secondary | ICD-10-CM

## 2021-01-09 DIAGNOSIS — O09299 Supervision of pregnancy with other poor reproductive or obstetric history, unspecified trimester: Secondary | ICD-10-CM | POA: Diagnosis present

## 2021-01-24 ENCOUNTER — Other Ambulatory Visit: Payer: Self-pay

## 2021-01-24 ENCOUNTER — Ambulatory Visit (INDEPENDENT_AMBULATORY_CARE_PROVIDER_SITE_OTHER): Payer: Medicaid Other | Admitting: Obstetrics and Gynecology

## 2021-01-24 DIAGNOSIS — Z3A34 34 weeks gestation of pregnancy: Secondary | ICD-10-CM

## 2021-01-24 DIAGNOSIS — Z3480 Encounter for supervision of other normal pregnancy, unspecified trimester: Secondary | ICD-10-CM

## 2021-01-24 NOTE — Progress Notes (Signed)
   PRENATAL VISIT NOTE  Subjective:  Lindsey Bennett is a 20 y.o. [phone] at [redacted]w[redacted]d being seen today for ongoing prenatal care.  She is currently monitored for the following issues for this low-risk pregnancy and has BMI 32.0-32.9,adult; Supervision of other normal pregnancy, antepartum; History of pre-eclampsia; Rubella non-immune status, antepartum; and Chlamydia infection affecting pregnancy on their problem list.  Patient reports no complaints.  Contractions: Not present. Vag. Bleeding: None.  Movement: Present. Denies leaking of fluid.   The following portions of the patient's history were reviewed and updated as appropriate: allergies, current medications, past family history, past medical history, past social history, past surgical history and problem list.   Objective:   Vitals:   01/24/21 0852  BP: 118/79  Pulse: 89  Weight: 200 lb (90.7 kg)    Fetal Status: Fetal Heart Rate (bpm): 156   Movement: Present     General:  Alert, oriented and cooperative. Patient is in no acute distress.  Skin: Skin is warm and dry. No rash noted.   Cardiovascular: Normal heart rate noted  Respiratory: Normal respiratory effort, no problems with respiration noted  Abdomen: Soft, gravid, appropriate for gestational age.  Pain/Pressure: Absent     Pelvic: Cervical exam deferred        Extremities: Normal range of motion.  Edema: None  Mental Status: Normal mood and affect. Normal behavior. Normal judgment and thought content.   Assessment and Plan:  Pregnancy: [id number] at [redacted]w[redacted]d   Supervision of other normal pregnancy, antepartum   - RUQ pain has resolved, she continues to eat low fat diet. - Continue ASA - 2 week visit for GC and GBS - preterm labor precautions   Preterm labor symptoms and general obstetric precautions including but not limited to vaginal bleeding, contractions, leaking of fluid and fetal movement were reviewed in detail with the patient. Please refer to After Visit  Summary for other counseling recommendations.   No follow-ups on file.  No future appointments.  Venia Carbon, NP

## 2021-02-06 ENCOUNTER — Encounter (HOSPITAL_COMMUNITY): Payer: Self-pay | Admitting: Obstetrics & Gynecology

## 2021-02-06 ENCOUNTER — Encounter (HOSPITAL_COMMUNITY): Payer: Self-pay | Admitting: Obstetrics and Gynecology

## 2021-02-06 ENCOUNTER — Other Ambulatory Visit: Payer: Self-pay

## 2021-02-06 ENCOUNTER — Inpatient Hospital Stay (EMERGENCY_DEPARTMENT_HOSPITAL)
Admission: AD | Admit: 2021-02-06 | Discharge: 2021-02-07 | Disposition: A | Discharge status: Home / Self Care | Payer: Medicaid Other | Source: Home / Self Care | Attending: Obstetrics & Gynecology | Admitting: Obstetrics & Gynecology

## 2021-02-06 ENCOUNTER — Inpatient Hospital Stay (HOSPITAL_COMMUNITY)
Admission: AD | Admit: 2021-02-06 | Discharge: 2021-02-06 | Disposition: A | Discharge status: Home / Self Care | Payer: Medicaid Other | Attending: Obstetrics and Gynecology | Admitting: Obstetrics and Gynecology

## 2021-02-06 DIAGNOSIS — O4703 False labor before 37 completed weeks of gestation, third trimester: Secondary | ICD-10-CM | POA: Insufficient documentation

## 2021-02-06 DIAGNOSIS — O133 Gestational [pregnancy-induced] hypertension without significant proteinuria, third trimester: Secondary | ICD-10-CM | POA: Insufficient documentation

## 2021-02-06 DIAGNOSIS — O99891 Other specified diseases and conditions complicating pregnancy: Secondary | ICD-10-CM | POA: Diagnosis not present

## 2021-02-06 DIAGNOSIS — Z20822 Contact with and (suspected) exposure to covid-19: Secondary | ICD-10-CM | POA: Insufficient documentation

## 2021-02-06 DIAGNOSIS — R109 Unspecified abdominal pain: Secondary | ICD-10-CM | POA: Diagnosis present

## 2021-02-06 DIAGNOSIS — Z7982 Long term (current) use of aspirin: Secondary | ICD-10-CM | POA: Diagnosis not present

## 2021-02-06 DIAGNOSIS — R509 Fever, unspecified: Secondary | ICD-10-CM | POA: Diagnosis present

## 2021-02-06 DIAGNOSIS — Z28310 Unvaccinated for covid-19: Secondary | ICD-10-CM | POA: Diagnosis not present

## 2021-02-06 DIAGNOSIS — O368390 Maternal care for abnormalities of the fetal heart rate or rhythm, unspecified trimester, not applicable or unspecified: Secondary | ICD-10-CM

## 2021-02-06 DIAGNOSIS — Z3A36 36 weeks gestation of pregnancy: Secondary | ICD-10-CM

## 2021-02-06 DIAGNOSIS — Z8759 Personal history of other complications of pregnancy, childbirth and the puerperium: Secondary | ICD-10-CM

## 2021-02-06 DIAGNOSIS — O163 Unspecified maternal hypertension, third trimester: Secondary | ICD-10-CM

## 2021-02-06 DIAGNOSIS — O26893 Other specified pregnancy related conditions, third trimester: Secondary | ICD-10-CM | POA: Diagnosis not present

## 2021-02-06 DIAGNOSIS — Z2839 Other underimmunization status: Secondary | ICD-10-CM

## 2021-02-06 DIAGNOSIS — A749 Chlamydial infection, unspecified: Secondary | ICD-10-CM

## 2021-02-06 DIAGNOSIS — O368330 Maternal care for abnormalities of the fetal heart rate or rhythm, third trimester, not applicable or unspecified: Secondary | ICD-10-CM | POA: Diagnosis not present

## 2021-02-06 DIAGNOSIS — Z3480 Encounter for supervision of other normal pregnancy, unspecified trimester: Secondary | ICD-10-CM

## 2021-02-06 DIAGNOSIS — O479 False labor, unspecified: Secondary | ICD-10-CM

## 2021-02-06 LAB — COMPREHENSIVE METABOLIC PANEL
ALT: 16 U/L (ref 0–44)
AST: 28 U/L (ref 15–41)
Albumin: 2.9 g/dL — ABNORMAL LOW (ref 3.5–5.0)
Alkaline Phosphatase: 152 U/L — ABNORMAL HIGH (ref 38–126)
Anion gap: 11 (ref 5–15)
BUN: 8 mg/dL (ref 6–20)
CO2: 20 mmol/L — ABNORMAL LOW (ref 22–32)
Calcium: 9.7 mg/dL (ref 8.9–10.3)
Chloride: 102 mmol/L (ref 98–111)
Creatinine, Ser: 0.59 mg/dL (ref 0.44–1.00)
GFR, Estimated: >60 mL/min (ref >60)
Glucose, Bld: 94 mg/dL (ref 70–99)
Potassium: 3.9 mmol/L (ref 3.5–5.1)
Sodium: 133 mmol/L — ABNORMAL LOW (ref 135–145)
Total Bilirubin: 0.4 mg/dL (ref 0.3–1.2)
Total Protein: 6.6 g/dL (ref 6.5–8.1)

## 2021-02-06 LAB — CBC WITH DIFFERENTIAL/PLATELET
Abs Immature Granulocytes: 0.05 10*3/uL (ref 0.00–0.07)
Basophils Absolute: 0 10*3/uL (ref 0.0–0.1)
Basophils Relative: 0 %
Eosinophils Absolute: 0.1 10*3/uL (ref 0.0–0.5)
Eosinophils Relative: 1 %
HCT: 38.4 % (ref 36.0–46.0)
Hemoglobin: 12.4 g/dL (ref 12.0–15.0)
Immature Granulocytes: 1 %
Lymphocytes Relative: 7 %
Lymphs Abs: 0.7 10*3/uL (ref 0.7–4.0)
MCH: 25.8 pg — ABNORMAL LOW (ref 26.0–34.0)
MCHC: 32.3 g/dL (ref 30.0–36.0)
MCV: 79.8 fL — ABNORMAL LOW (ref 80.0–100.0)
Monocytes Absolute: 0.7 10*3/uL (ref 0.1–1.0)
Monocytes Relative: 7 %
Neutro Abs: 9.1 10*3/uL — ABNORMAL HIGH (ref 1.7–7.7)
Neutrophils Relative %: 84 %
Platelets: 274 10*3/uL (ref 150–400)
RBC: 4.81 MIL/uL (ref 3.87–5.11)
RDW: 13.4 % (ref 11.5–15.5)
WBC: 10.8 10*3/uL — ABNORMAL HIGH (ref 4.0–10.5)
nRBC: 0 % (ref 0.0–0.2)

## 2021-02-06 LAB — URINALYSIS, ROUTINE W REFLEX MICROSCOPIC
Bilirubin Urine: NEGATIVE
Glucose, UA: NEGATIVE mg/dL
Hgb urine dipstick: NEGATIVE
Ketones, ur: 5 mg/dL — AB
Leukocytes,Ua: NEGATIVE
Nitrite: NEGATIVE
Protein, ur: NEGATIVE mg/dL
Specific Gravity, Urine: 1.018 (ref 1.005–1.030)
pH: 6 (ref 5.0–8.0)

## 2021-02-06 LAB — PROTEIN / CREATININE RATIO, URINE
Creatinine, Urine: 133.73 mg/dL
Protein Creatinine Ratio: 0.1 mg/mg{Cre} (ref 0.00–0.15)
Total Protein, Urine: 13 mg/dL

## 2021-02-06 LAB — SARS CORONAVIRUS 2 (TAT 6-24 HRS): SARS Coronavirus 2: NEGATIVE

## 2021-02-06 MED ORDER — LACTATED RINGERS IV BOLUS
1000.0000 mL | Freq: Once | INTRAVENOUS | Status: AC
  Administered 2021-02-06: 1000 mL via INTRAVENOUS

## 2021-02-06 MED ORDER — ACETAMINOPHEN 325 MG PO TABS: 650.0000 mg | ORAL_TABLET | ORAL | 0 refills | Status: DC | PRN

## 2021-02-06 MED ORDER — ACETAMINOPHEN 500 MG PO TABS
1000.0000 mg | ORAL_TABLET | Freq: Once | ORAL | Status: AC
  Administered 2021-02-06: 1000 mg via ORAL
  Filled 2021-02-06: qty 2

## 2021-02-06 NOTE — MAU Note (Signed)
Pt reports ctx's since 10am Pt reports they started to be regular at 1600.   Denies vaginal bleeding or LOF.   Reports +FM

## 2021-02-06 NOTE — MAU Provider Note (Addendum)
History     CSN: [id number]  Arrival date and time: 02/06/21 0709   Event Date/Time   First Provider Initiated Contact with Patient 02/06/21 [phone]      Chief Complaint  Patient presents with   Fever   HPI Lindsey Bennett is a 20 y.o. [id number] at [redacted]w[redacted]d who presents to MAU with chief complaint of fever. This is a new problem, onset early this morning. Patient endorses oral temp of 101. She has not been vaccinated for COVID. She denies exposure to anyone with a fever. No other members of her household are sick.  Patient also c/o of right mid-abdominal pain, new onset this morning. Her pain resolved without intervention prior to her arrival in MAU. She denies abdominal tenderness, dysuria, flank pain.  She denies vaginal bleeding, leaking of fluid, decreased fetal movement, fever, falls, or recent illness.   Patient receives prenatal care with [facility] and her next appointment is 02/09/2021.  OB History     Gravida  4   Para  1   Term  1   Preterm  0   AB  2   Living  1      SAB  2   IAB  0   Ectopic  0   Multiple  0   Live Births  1           Past Medical History:  Diagnosis Date   Medical history non-contributory     Past Surgical History:  Procedure Laterality Date   NO PAST SURGERIES     WISDOM TOOTH EXTRACTION Bilateral 2017    Family History  Problem Relation Age of Onset   Obesity Neg Hx    Hypertension Neg Hx    Heart disease Neg Hx    Diabetes Neg Hx     Social History   Tobacco Use   Smoking status: Never   Smokeless tobacco: Never  Vaping Use   Vaping Use: Never used  Substance Use Topics   Alcohol use: No   Drug use: No    Allergies: No Known Allergies  Medications Prior to Admission  Medication Sig Dispense Refill Last Dose   aspirin 81 MG chewable tablet Chew 1 tablet (81 mg total) by mouth daily. Start at 13 weeks 60 tablet 3 02/05/2021   Prenatal Vit-Fe Fumarate-FA (PRENATAL MULTIVITAMIN) TABS tablet Take 1 tablet by  mouth daily at 12 noon.   02/05/2021   famotidine (PEPCID) 20 MG tablet Take 1 tablet (20 mg total) by mouth 2 (two) times daily. (Patient not taking: No sig reported) 60 tablet 1     Review of Systems  Constitutional:  Positive for fever.  Gastrointestinal:  Positive for abdominal pain.  All other systems reviewed and are negative. Physical Exam   Blood pressure (!) 115/58, pulse (!) 101, temperature 99.7 F (37.6 C), temperature source Oral, resp. rate 15, last menstrual period 05/26/2020, SpO2 98 %, unknown if currently breastfeeding.  Physical Exam Vitals and nursing note reviewed. Exam conducted with a chaperone present.  Cardiovascular:     Rate and Rhythm: Normal rate and regular rhythm.     Pulses: Normal pulses.     Heart sounds: Normal heart sounds.  Pulmonary:     Effort: Pulmonary effort is normal.     Breath sounds: Normal breath sounds.  Abdominal:     Comments: Gravid  Skin:    Capillary Refill: Capillary refill takes less than 2 seconds.  Neurological:     Mental Status: She is alert  and oriented to person, place, and time.  Psychiatric:        Mood and Affect: Mood normal.        Behavior: Behavior normal.        Thought Content: Thought content normal.        Judgment: Judgment normal.    MAU Course  Procedures  --Elevated BP on arrival. Also noted on 12/22/2020 in MAU. Patient meets criteria for GHTN. PEC labs WNL --Fetal tachycardia on arrival. Resolved with IV fluid bolus --COVID negative Pertinent negatives: chest pain, SOB, activity intolerance, pain, weakness, syncope --Notified by lab that machine used to run rapid COVID tests is down for maintenance for next six hours. Swab converted to 6-24 hours test.  Patient Vitals for the past 24 hrs:  BP Temp Temp src Pulse Resp SpO2  02/06/21 0901 (!) 115/58 -- -- (!) 101 15 98 %  02/06/21 0831 120/63 -- -- 100 -- --  02/06/21 0816 126/66 -- -- 97 -- --  02/06/21 0815 126/66 -- -- 97 -- 98 %  02/06/21  0801 140/84 -- -- (!) 112 -- --  02/06/21 0751 127/75 -- -- (!) 106 -- --  02/06/21 0726 (!) 141/75 99.7 F (37.6 C) Oral (!) 110 18 --   Results for orders placed or performed during the hospital encounter of 02/06/21 (from the past 24 hour(s))  CBC with Differential/Platelet     Status: Abnormal   Collection Time: 02/06/21  7:42 AM  Result Value Ref Range   WBC 10.8 (H) 4.0 - 10.5 K/uL   RBC 4.81 3.87 - 5.11 MIL/uL   Hemoglobin 12.4 12.0 - 15.0 g/dL   HCT 76.7 34.1 - 93.7 %   MCV 79.8 (L) 80.0 - 100.0 fL   MCH 25.8 (L) 26.0 - 34.0 pg   MCHC 32.3 30.0 - 36.0 g/dL   RDW 90.2 40.9 - 73.5 %   Platelets 274 150 - 400 K/uL   nRBC 0.0 0.0 - 0.2 %   Neutrophils Relative % 84 %   Neutro Abs 9.1 (H) 1.7 - 7.7 K/uL   Lymphocytes Relative 7 %   Lymphs Abs 0.7 0.7 - 4.0 K/uL   Monocytes Relative 7 %   Monocytes Absolute 0.7 0.1 - 1.0 K/uL   Eosinophils Relative 1 %   Eosinophils Absolute 0.1 0.0 - 0.5 K/uL   Basophils Relative 0 %   Basophils Absolute 0.0 0.0 - 0.1 K/uL   Immature Granulocytes 1 %   Abs Immature Granulocytes 0.05 0.00 - 0.07 K/uL  Comprehensive metabolic panel     Status: Abnormal   Collection Time: 02/06/21  7:42 AM  Result Value Ref Range   Sodium 133 (L) 135 - 145 mmol/L   Potassium 3.9 3.5 - 5.1 mmol/L   Chloride 102 98 - 111 mmol/L   CO2 20 (L) 22 - 32 mmol/L   Glucose, Bld 94 70 - 99 mg/dL   BUN 8 6 - 20 mg/dL   Creatinine, Ser 3.29 0.44 - 1.00 mg/dL   Calcium 9.7 8.9 - 92.4 mg/dL   Total Protein 6.6 6.5 - 8.1 g/dL   Albumin 2.9 (L) 3.5 - 5.0 g/dL   AST 28 15 - 41 U/L   ALT 16 0 - 44 U/L   Alkaline Phosphatase 152 (H) 38 - 126 U/L   Total Bilirubin 0.4 0.3 - 1.2 mg/dL   GFR, Estimated >26 >83 mL/min   Anion gap 11 5 - 15  Protein / creatinine ratio, urine  Status: None   Collection Time: 02/06/21  7:42 AM  Result Value Ref Range   Creatinine, Urine 133.73 mg/dL   Total Protein, Urine 13 mg/dL   Protein Creatinine Ratio 0.10 0.00 - 0.15 mg/mg[Cre]   Urinalysis, Routine w reflex microscopic Urine, Clean Catch     Status: Abnormal   Collection Time: 02/06/21  7:42 AM  Result Value Ref Range   Color, Urine YELLOW YELLOW   APPearance HAZY (A) CLEAR   Specific Gravity, Urine 1.018 1.005 - 1.030   pH 6.0 5.0 - 8.0   Glucose, UA NEGATIVE NEGATIVE mg/dL   Hgb urine dipstick NEGATIVE NEGATIVE   Bilirubin Urine NEGATIVE NEGATIVE   Ketones, ur 5 (A) NEGATIVE mg/dL   Protein, ur NEGATIVE NEGATIVE mg/dL   Nitrite NEGATIVE NEGATIVE   Leukocytes,Ua NEGATIVE NEGATIVE  SARS CORONAVIRUS 2 (TAT 6-24 HRS)     Status: None   Collection Time: 02/06/21  7:42 AM  Result Value Ref Range   SARS Coronavirus 2 NEGATIVE NEGATIVE    Assessment and Plan  --20 y.o. [id number] at [redacted]w[redacted]d  --Reactive tracing --Closed cervix --COVID negative, no acute symptoms --GHTN, normal PEC labs --Reviewed OTC options for symptom management --Discharge home in stable condition  F/U: --Maternal fever or unknown origin. Next appointment 08/05 with Shelly Coss, CNM. Pt warned this will likely be virtual given fever, despite negative COVID.   Calvert Cantor, CNM 02/06/2021, 3:40 PM

## 2021-02-06 NOTE — MAU Note (Signed)
.  Lindsey Bennett is a 20 y.o. at [redacted]w[redacted]d here in MAU reporting: fever that started at 0200. States she also started having lower abdominal pain at the same time but is not having it any longer. Fever was 101 when she checked it and has not taken any medication. Denies VB or LOF. Endorses good fetal movement. Denies HA, does report floaters. Denies swelling and epigastric pain.   Pain score: 0 Vitals:   02/06/21 0726  BP: (!) 141/75  Pulse: (!) 110  Resp: 18  Temp: 99.7 F (37.6 C)     FHT:188 Lab orders placed from triage:  UA

## 2021-02-07 DIAGNOSIS — Z3A36 36 weeks gestation of pregnancy: Secondary | ICD-10-CM

## 2021-02-07 DIAGNOSIS — O4703 False labor before 37 completed weeks of gestation, third trimester: Secondary | ICD-10-CM

## 2021-02-07 NOTE — MAU Provider Note (Signed)
S: Patient is here for RN labor evaluation. Strip, vital signs, & chart Reviewed   O:  Vitals:   02/06/21 2125 02/06/21 2128 02/06/21 2345  BP:  127/63 (!) 115/58  Pulse:  81 98  Resp: 12    Temp: 99.7 F (37.6 C)    SpO2: 97%  99%     Dilation: Closed Exam by:: Alondra Sedano, RN   FHR: 150 bpm, Mod Var, no Decels, 15x15 Accels UC: irregular   A: 1. False labor   2. [redacted] weeks gestation of pregnancy      P:  RN to discharge home in stable condition with return precautions & fetal kick counts  Lindsey Horn FNP 12:24 AM

## 2021-02-08 LAB — CULTURE, BETA STREP (GROUP B ONLY)

## 2021-02-09 ENCOUNTER — Inpatient Hospital Stay (HOSPITAL_COMMUNITY)
Admission: AD | Admit: 2021-02-09 | Discharge: 2021-02-10 | Disposition: A | Discharge status: Home / Self Care | Payer: Medicaid Other | Attending: Obstetrics and Gynecology | Admitting: Obstetrics and Gynecology

## 2021-02-09 ENCOUNTER — Telehealth (INDEPENDENT_AMBULATORY_CARE_PROVIDER_SITE_OTHER): Payer: Medicaid Other

## 2021-02-09 ENCOUNTER — Other Ambulatory Visit: Payer: Self-pay

## 2021-02-09 ENCOUNTER — Telehealth (HOSPITAL_COMMUNITY): Payer: Self-pay | Admitting: *Deleted

## 2021-02-09 DIAGNOSIS — Z3A37 37 weeks gestation of pregnancy: Secondary | ICD-10-CM | POA: Insufficient documentation

## 2021-02-09 DIAGNOSIS — O479 False labor, unspecified: Secondary | ICD-10-CM

## 2021-02-09 DIAGNOSIS — Z3689 Encounter for other specified antenatal screening: Secondary | ICD-10-CM | POA: Insufficient documentation

## 2021-02-09 DIAGNOSIS — O139 Gestational [pregnancy-induced] hypertension without significant proteinuria, unspecified trimester: Secondary | ICD-10-CM | POA: Insufficient documentation

## 2021-02-09 DIAGNOSIS — O133 Gestational [pregnancy-induced] hypertension without significant proteinuria, third trimester: Secondary | ICD-10-CM

## 2021-02-09 DIAGNOSIS — Z3480 Encounter for supervision of other normal pregnancy, unspecified trimester: Secondary | ICD-10-CM

## 2021-02-09 DIAGNOSIS — O471 False labor at or after 37 completed weeks of gestation: Secondary | ICD-10-CM | POA: Insufficient documentation

## 2021-02-09 DIAGNOSIS — O98813 Other maternal infectious and parasitic diseases complicating pregnancy, third trimester: Secondary | ICD-10-CM

## 2021-02-09 DIAGNOSIS — O09293 Supervision of pregnancy with other poor reproductive or obstetric history, third trimester: Secondary | ICD-10-CM

## 2021-02-09 DIAGNOSIS — A749 Chlamydial infection, unspecified: Secondary | ICD-10-CM

## 2021-02-09 DIAGNOSIS — Z8759 Personal history of other complications of pregnancy, childbirth and the puerperium: Secondary | ICD-10-CM

## 2021-02-09 DIAGNOSIS — Z2839 Other underimmunization status: Secondary | ICD-10-CM

## 2021-02-09 NOTE — Telephone Encounter (Signed)
Preadmission screen  

## 2021-02-09 NOTE — Patient Instructions (Signed)

## 2021-02-09 NOTE — Addendum Note (Signed)
Addended by: Gerrit Heck L on: 02/09/2021 08:50 AM   Modules accepted: Orders, SmartSet

## 2021-02-09 NOTE — Progress Notes (Signed)
Pt changed appt to virtual because of congestion, cough and fever. COVID negative. Pt has not taken BP recently but was seen in the MAU on 02/06/21

## 2021-02-09 NOTE — Progress Notes (Addendum)
OBSTETRICS PRENATAL VIRTUAL VISIT ENCOUNTER NOTE  Provider location: [facility]   Patient location: Home  I connected with [name] on 02/09/21 at  8:10 AM EDT by MyChart Video Encounter and verified that I am speaking with the correct person using two identifiers. I discussed the limitations, risks, security and privacy concerns of performing an evaluation and management service virtually and the availability of in person appointments. I also discussed with the patient that there may be a patient responsible charge related to this service. The patient expressed understanding and agreed to proceed. Subjective:  Lindsey Bennett is a 20 y.o. [phone] at 39w0dbeing seen today for ongoing prenatal care.  She is currently monitored for the following issues for this low-risk pregnancy and has BMI 32.0-32.9,adult; Supervision of other normal pregnancy, antepartum; History of pre-eclampsia; Rubella non-immune status, antepartum; and Chlamydia infection affecting pregnancy on their problem list.  Patient reports  cough, fever, and runny nose .  She reports she was tested negative for Covid infection.  She endorses fetal movement. She reports some BH contractions and denies abdominal cramping.  She also denies vaginal concerns including discharge, bleeding, and leaking of fluid. Contractions: Not present. Vag. Bleeding: None.  Movement: Present. Denies any leaking of fluid.   The following portions of the patient's history were reviewed and updated as appropriate: allergies, current medications, past family history, past medical history, past social history, past surgical history and problem list.   Objective:  There were no vitals filed for this visit.  Fetal Status:     Movement: Present     General:  Alert, oriented and cooperative. Patient is in no acute distress.  Respiratory: Normal respiratory effort, no problems with respiration noted  Mental Status:  Normal mood and affect. Normal behavior. Normal judgment and thought content.  Rest of physical exam deferred due to type of encounter  Imaging: No results found.  Assessment and Plan:  Pregnancy: G4P1021 at 371w0d. Rubella non-immune status, antepartum Plan for MMR during PPP  2. Supervision of other normal pregnancy, antepartum -Some flu-like symptoms today. -Given list of pregnancy safe medications. -Instructed to report to ER for SOB or worsening of cold/flu symptoms and MAU for any pregnancy related concerns. -Anticipatory guidance for upcoming appts. -Patient to schedule next appt in 1 weeks for an in-person visit.   3. History of pre-eclampsia -Labs from 8/2 Normal  4. Chlamydia infection affecting pregnancy in third trimester -[facility]. Gestational Hypertension -Patient with elevated bp in MAU on 8/2 and before on 6/17 -Plan to schedule for IOL. -Admit orders placed.  -Will attempt to RTO early next week for GC/CT testing and IOL Thursday as schedule allots.   Term labor symptoms and general obstetric precautions including but not limited to vaginal bleeding, contractions, leaking of fluid and fetal movement were reviewed in detail with the patient. I discussed the assessment and treatment plan with the patient. The patient was provided an opportunity to ask questions and all were answered. The patient agreed with the plan and demonstrated an understanding of the instructions. The patient was advised to call back or seek an in-person office evaluation/[facility] any urgent or concerning symptoms. Please refer to After Visit Summary for other counseling recommendations.   I provided 4 minutes of face-to-face time during this encounter.  No follow-ups on file.  No future appointments.  JeMaryann ConnersCN[facility]  

## 2021-02-10 ENCOUNTER — Encounter (HOSPITAL_COMMUNITY): Payer: Self-pay | Admitting: Obstetrics and Gynecology

## 2021-02-10 DIAGNOSIS — Z3689 Encounter for other specified antenatal screening: Secondary | ICD-10-CM | POA: Diagnosis not present

## 2021-02-10 DIAGNOSIS — Z3A37 37 weeks gestation of pregnancy: Secondary | ICD-10-CM | POA: Diagnosis not present

## 2021-02-10 DIAGNOSIS — O471 False labor at or after 37 completed weeks of gestation: Secondary | ICD-10-CM | POA: Diagnosis present

## 2021-02-10 NOTE — MAU Provider Note (Signed)
None      S: Ms. Lindsey Bennett is a 20 y.o. [phone] at [redacted]w[redacted]d  who presents to MAU today complaining contractions q 2 minutes throughout the night. She denies vaginal bleeding. She denies LOF. She reports normal fetal movement.    O: BP 131/66 (BP Location: Left Arm)   Pulse 93   Temp 98.3 F (36.8 C) (Oral)   Resp 18   LMP 05/26/2020   SpO2 99%  GENERAL: Well-developed, well-nourished female in no acute distress.  HEAD: Normocephalic, atraumatic.  CHEST: Normal effort of breathing, regular heart rate ABDOMEN: Soft, nontender, gravid  Cervical exam:  Dilation: 1.5 Effacement (%): 50 Cervical Position: Posterior Station: -3 Presentation: Vertex Exam by:: Santiago Bur, RN   Fetal Monitoring: Baseline: 130 Variability: Mod Accelerations: 15 x 15 Decelerations: None Contractions: Irregular q 2-5 min   A: SIUP at [redacted]w[redacted]d  Cervix 1.5 cm after 2 hours of observation Cat I tracing Normotensive  P: Discharge home in stable condition  Clayton Bibles, CNM 02/10/2021 2:16 AM

## 2021-02-10 NOTE — MAU Note (Signed)
Reports contractions are more intense and it frequent. +FM. Denies leaking of fluid. Reports bloody show.

## 2021-02-12 ENCOUNTER — Telehealth (HOSPITAL_COMMUNITY): Payer: Self-pay | Admitting: *Deleted

## 2021-02-12 NOTE — Telephone Encounter (Signed)
Preadmission screen  

## 2021-02-13 ENCOUNTER — Other Ambulatory Visit: Payer: Self-pay

## 2021-02-13 ENCOUNTER — Other Ambulatory Visit (HOSPITAL_COMMUNITY)
Admission: RE | Admit: 2021-02-13 | Discharge: 2021-02-13 | Disposition: A | Discharge status: Home / Self Care | Payer: Medicaid Other | Source: Ambulatory Visit | Attending: Advanced Practice Midwife | Admitting: Advanced Practice Midwife

## 2021-02-13 ENCOUNTER — Other Ambulatory Visit: Payer: Self-pay | Admitting: Obstetrics & Gynecology

## 2021-02-13 ENCOUNTER — Ambulatory Visit (INDEPENDENT_AMBULATORY_CARE_PROVIDER_SITE_OTHER): Payer: Medicaid Other | Admitting: Advanced Practice Midwife

## 2021-02-13 VITALS — BP 139/79 | HR 99 | Wt 204.0 lb

## 2021-02-13 DIAGNOSIS — Z3480 Encounter for supervision of other normal pregnancy, unspecified trimester: Secondary | ICD-10-CM | POA: Diagnosis present

## 2021-02-13 DIAGNOSIS — R030 Elevated blood-pressure reading, without diagnosis of hypertension: Secondary | ICD-10-CM

## 2021-02-13 DIAGNOSIS — Z3A37 37 weeks gestation of pregnancy: Secondary | ICD-10-CM

## 2021-02-13 DIAGNOSIS — Z8759 Personal history of other complications of pregnancy, childbirth and the puerperium: Secondary | ICD-10-CM

## 2021-02-13 NOTE — Patient Instructions (Signed)
Things to Try After 37 weeks to Encourage Labor/Get Ready for Labor:    Try the Miles Circuit at www.milescircuit.com daily to improve baby's position and encourage the onset of labor.  Walk a little and rest a little every day.  Change positions often.  Cervical Ripening: May try one or both Red Raspberry Leaf capsules or tea:  two 300mg or 400mg tablets with each meal, 2-3 times a day, or 1-3 cups of tea daily  Potential Side Effects Of Raspberry Leaf:  Most women do not experience any side effects from drinking raspberry leaf tea. However, nausea and loose stools are possible   Evening Primrose Oil capsules: take 1 capsule by mouth and place one capsule in the vagina every night.    Some of the potential side effects:  Upset stomach  Loose stools or diarrhea  Headaches  Nausea  Sex can also help the cervix ripen and encourage labor onset.    Labor Precautions Reasons to come to MAU at [facility]:  1.  Contractions are  5 minutes apart or less, each last 1 minute, these have been going on for 1-2 hours, and you cannot walk or talk during them 2.  You have a large gush of fluid, or a trickle of fluid that will not stop and you have to wear a pad 3.  You have bleeding that is bright red, heavier than spotting--like menstrual bleeding (spotting can be normal in early labor or after a check of your cervix) 4.  You do not feel the baby moving like he/she normally does  

## 2021-02-13 NOTE — Progress Notes (Signed)
   PRENATAL VISIT NOTE  Subjective:  Lindsey Bennett is a 20 y.o. [phone] at [redacted]w[redacted]d being seen today for ongoing prenatal care.  She is currently monitored for the following issues for this low-risk pregnancy and has BMI 32.0-32.9,adult; Supervision of other normal pregnancy, antepartum; History of pre-eclampsia; Rubella non-immune status, antepartum; and Chlamydia infection affecting pregnancy on their problem list.  Patient reports occasional contractions.  Contractions: Not present. Vag. Bleeding: None.  Movement: Present. Denies leaking of fluid.   The following portions of the patient's history were reviewed and updated as appropriate: allergies, current medications, past family history, past medical history, past social history, past surgical history and problem list.   Objective:   Vitals:   02/13/21 1352  BP: 139/79  Pulse: 99  Weight: 204 lb (92.5 kg)    Fetal Status: Fetal Heart Rate (bpm): 156   Movement: Present     General:  Alert, oriented and cooperative. Patient is in no acute distress.  Skin: Skin is warm and dry. No rash noted.   Cardiovascular: Normal heart rate noted  Respiratory: Normal respiratory effort, no problems with respiration noted  Abdomen: Soft, gravid, appropriate for gestational age.  Pain/Pressure: Present     Pelvic: Cervical exam deferred        Extremities: Normal range of motion.  Edema: None  Mental Status: Normal mood and affect. Normal behavior. Normal judgment and thought content.   Assessment and Plan:  Pregnancy: G4P1021 at [redacted]w[redacted]d 1. Supervision of other normal pregnancy, antepartum --Anticipatory guidance about next visits/weeks of pregnancy given. --BP check on Friday, prenatal appt in 1 week, see below  - Cervicovaginal ancillary only( [place])  2. Elevated blood pressure reading without diagnosis of hypertension --Pt with hx PEC in previous pregnancy --Dx was made of GHTN on 02/09/21 due to chart review. --Current pregnancy  with single elevated BP on 6/17 in MAU with normal repeat, pt presented with RUQ pain --Also, single elevated BP at 37 weeks followed by 110s and 120s. Pt enters weekly BP into Babyscripts and BPs have been grossly normal. --Pt does not desire IOL at 37 weeks so I reviewed chart and then consulted Dr Adrian Blackwater and Dr Debroah Loop on 02/13/21.    --Per Drs Adrian Blackwater and Debroah Loop, spurious BPs surrounded by normal BPs do not meet criteria and pt can be watched closely outpatient.  BP check on Friday.  --PEC labs were wnl on 02/06/21, pt without s/sx of PEC, reviewed reasons to seek care in MAU --Pt aware that another elevated BP would indicate need for IOL  3. History of pre-eclampsia   4. [redacted] weeks gestation of pregnancy   Term labor symptoms and general obstetric precautions including but not limited to vaginal bleeding, contractions, leaking of fluid and fetal movement were reviewed in detail with the patient. Please refer to After Visit Summary for other counseling recommendations.   No follow-ups on file.  Future Appointments  Date Time Provider Department Center  02/15/2021  6:40 AM [facility] SCHED ROOM [facility] None  02/16/2021  9:30 AM [facility] NURSE [facility]  02/20/2021 10:30 AM Brand Males, CNM [facility]    Sharen Counter, CNM

## 2021-02-14 LAB — CERVICOVAGINAL ANCILLARY ONLY
Chlamydia: NEGATIVE
Comment: NEGATIVE
Comment: NORMAL
Neisseria Gonorrhea: NEGATIVE

## 2021-02-14 LAB — SARS CORONAVIRUS 2 (TAT 6-24 HRS): SARS Coronavirus 2: POSITIVE — AB

## 2021-02-14 NOTE — Progress Notes (Signed)
[place] in L&D notified of patient's covid test result.

## 2021-02-15 ENCOUNTER — Inpatient Hospital Stay (HOSPITAL_COMMUNITY)
Admission: AD | Admit: 2021-02-15 | Payer: Medicaid Other | Source: Home / Self Care | Admitting: Obstetrics & Gynecology

## 2021-02-15 ENCOUNTER — Inpatient Hospital Stay (HOSPITAL_COMMUNITY): Payer: Medicaid Other

## 2021-02-15 ENCOUNTER — Telehealth: Payer: Self-pay | Admitting: Advanced Practice Midwife

## 2021-02-15 NOTE — Telephone Encounter (Signed)
Called pt to inform her of COVID positive on preadmission testing on 02/13/21. Verified pt identity using 2 patient identifiers.     Pt is G4P1021  at [redacted]w[redacted]d  and was originally scheduled for IOL on 02/16/21 for [facility] but further review and pt preference let to change in plan of care and pt had BP check in office 8/12 and prenatal appointment on 8/16 scheduled.    Given COVID positive status, BP check on 8/12 in office is cancelled and can be scheduled virtually or pt can enter BP into Babyscripts on 8/12.  Appt on 8/16 will be day 7 following positive test so if pt is asymptomatic can keep appointment with provider with additional NST/AFI.

## 2021-02-16 ENCOUNTER — Ambulatory Visit: Payer: Medicaid Other | Admitting: *Deleted

## 2021-02-20 ENCOUNTER — Other Ambulatory Visit: Payer: Self-pay

## 2021-02-20 ENCOUNTER — Ambulatory Visit (INDEPENDENT_AMBULATORY_CARE_PROVIDER_SITE_OTHER): Payer: Medicaid Other

## 2021-02-20 VITALS — BP 126/76 | HR 74 | Wt 204.0 lb

## 2021-02-20 DIAGNOSIS — Z8759 Personal history of other complications of pregnancy, childbirth and the puerperium: Secondary | ICD-10-CM | POA: Diagnosis not present

## 2021-02-20 DIAGNOSIS — Z3A38 38 weeks gestation of pregnancy: Secondary | ICD-10-CM

## 2021-02-20 DIAGNOSIS — R030 Elevated blood-pressure reading, without diagnosis of hypertension: Secondary | ICD-10-CM

## 2021-02-20 DIAGNOSIS — Z3480 Encounter for supervision of other normal pregnancy, unspecified trimester: Secondary | ICD-10-CM

## 2021-02-20 NOTE — Progress Notes (Signed)
   PRENATAL VISIT NOTE  Subjective:  Lindsey Bennett is a 20 y.o. [phone] at [redacted]w[redacted]d being seen today for ongoing prenatal care.  She is currently monitored for the following issues for this low-risk pregnancy and has BMI 32.0-32.9,adult; Supervision of other normal pregnancy, antepartum; History of pre-eclampsia; Rubella non-immune status, antepartum; Chlamydia infection affecting pregnancy; and Elevated BP without diagnosis of hypertension on their problem list.  Patient reports no complaints.  Contractions: Not present. Vag. Bleeding: None.  Movement: Present. Denies leaking of fluid.   The following portions of the patient's history were reviewed and updated as appropriate: allergies, current medications, past family history, past medical history, past social history, past surgical history and problem list.   Objective:   Vitals:   02/20/21 1038  BP: 126/76  Pulse: 74  Weight: 204 lb (92.5 kg)    Fetal Status:   Fundal Height: 40 cm Movement: Present  Presentation: Vertex  General:  Alert, oriented and cooperative. Patient is in no acute distress.  Skin: Skin is warm and dry. No rash noted.   Cardiovascular: Normal heart rate noted  Respiratory: Normal respiratory effort, no problems with respiration noted  Abdomen: Soft, gravid, appropriate for gestational age.  Pain/Pressure: Present     Pelvic: Cervical exam performed in the presence of a chaperone Dilation: 1 Effacement (%): 50 Station: Ballotable  Extremities: Normal range of motion.  Edema: None  Mental Status: Normal mood and affect. Normal behavior. Normal judgment and thought content.   Assessment and Plan:  Pregnancy: G4P1021 at [redacted]w[redacted]d  1. Supervision of other normal pregnancy, antepartum - Routine OB care. Doing well. No concerns - Active fetal movement.  - NST and AFI today. NST reactive. AFI wnl - Desires 40 week IOL.   2. Elevated BP without diagnosis of hypertension - See note from 8/9 - Normotensive today.  Patient asymptomatic  3. History of pre-eclampsia - Reviewed warning signs/symptoms  4. [redacted] weeks gestation of pregnancy    Term labor symptoms and general obstetric precautions including but not limited to vaginal bleeding, contractions, leaking of fluid and fetal movement were reviewed in detail with the patient. Please refer to After Visit Summary for other counseling recommendations.   Return in about 1 week (around 02/27/2021).  Future Appointments  Date Time Provider Department Center  02/27/2021 10:10 AM Leftwich-Kirby, Wilmer Floor, CNM [facility]     Brand Males, CNM 02/20/21 11:43 AM

## 2021-02-21 ENCOUNTER — Other Ambulatory Visit: Payer: Self-pay | Admitting: Advanced Practice Midwife

## 2021-02-23 ENCOUNTER — Telehealth (HOSPITAL_COMMUNITY): Payer: Self-pay | Admitting: *Deleted

## 2021-02-23 NOTE — Telephone Encounter (Signed)
Preadmission screen  

## 2021-02-24 ENCOUNTER — Encounter (HOSPITAL_COMMUNITY): Payer: Self-pay | Admitting: Obstetrics and Gynecology

## 2021-02-24 ENCOUNTER — Other Ambulatory Visit: Payer: Self-pay

## 2021-02-24 ENCOUNTER — Inpatient Hospital Stay (HOSPITAL_COMMUNITY)
Admission: AD | Admit: 2021-02-24 | Discharge: 2021-02-25 | DRG: 807 | Disposition: A | Discharge status: Home / Self Care | Payer: Medicaid Other | Attending: Obstetrics and Gynecology | Admitting: Obstetrics and Gynecology

## 2021-02-24 DIAGNOSIS — Z3480 Encounter for supervision of other normal pregnancy, unspecified trimester: Secondary | ICD-10-CM

## 2021-02-24 DIAGNOSIS — Z3A39 39 weeks gestation of pregnancy: Secondary | ICD-10-CM

## 2021-02-24 DIAGNOSIS — O26893 Other specified pregnancy related conditions, third trimester: Secondary | ICD-10-CM | POA: Diagnosis present

## 2021-02-24 DIAGNOSIS — O09899 Supervision of other high risk pregnancies, unspecified trimester: Secondary | ICD-10-CM

## 2021-02-24 DIAGNOSIS — Z8759 Personal history of other complications of pregnancy, childbirth and the puerperium: Secondary | ICD-10-CM

## 2021-02-24 DIAGNOSIS — Z23 Encounter for immunization: Secondary | ICD-10-CM

## 2021-02-24 DIAGNOSIS — O98813 Other maternal infectious and parasitic diseases complicating pregnancy, third trimester: Secondary | ICD-10-CM

## 2021-02-24 DIAGNOSIS — R030 Elevated blood-pressure reading, without diagnosis of hypertension: Secondary | ICD-10-CM | POA: Diagnosis not present

## 2021-02-24 DIAGNOSIS — Z3400 Encounter for supervision of normal first pregnancy, unspecified trimester: Secondary | ICD-10-CM

## 2021-02-24 DIAGNOSIS — Z2839 Other underimmunization status: Secondary | ICD-10-CM

## 2021-02-24 DIAGNOSIS — A749 Chlamydial infection, unspecified: Principal | ICD-10-CM

## 2021-02-24 LAB — TYPE AND SCREEN
ABO/RH(D): A POS
Antibody Screen: NEGATIVE

## 2021-02-24 LAB — CBC
HCT: 41 % (ref 36.0–46.0)
Hemoglobin: 13 g/dL (ref 12.0–15.0)
MCH: 25 pg — ABNORMAL LOW (ref 26.0–34.0)
MCHC: 31.7 g/dL (ref 30.0–36.0)
MCV: 79 fL — ABNORMAL LOW (ref 80.0–100.0)
Platelets: 391 10*3/uL (ref 150–400)
RBC: 5.19 MIL/uL — ABNORMAL HIGH (ref 3.87–5.11)
RDW: 14.7 % (ref 11.5–15.5)
WBC: 13.2 10*3/uL — ABNORMAL HIGH (ref 4.0–10.5)
nRBC: 0 % (ref 0.0–0.2)

## 2021-02-24 LAB — RPR: RPR Ser Ql: NONREACTIVE

## 2021-02-24 MED ORDER — OXYCODONE-ACETAMINOPHEN 5-325 MG PO TABS: 2.0000 | ORAL_TABLET | ORAL | Status: DC | PRN

## 2021-02-24 MED ORDER — ACETAMINOPHEN 325 MG PO TABS: 650.0000 mg | ORAL_TABLET | ORAL | Status: DC | PRN

## 2021-02-24 MED ORDER — FENTANYL CITRATE (PF) 100 MCG/2ML IJ SOLN
INTRAMUSCULAR | Status: AC
  Administered 2021-02-24: 100 ug
  Filled 2021-02-24: qty 2

## 2021-02-24 MED ORDER — OXYTOCIN BOLUS FROM INFUSION
333.0000 mL | Freq: Once | INTRAVENOUS | Status: AC
  Administered 2021-02-24: 333 mL via INTRAVENOUS

## 2021-02-24 MED ORDER — FENTANYL CITRATE (PF) 100 MCG/2ML IJ SOLN: 100.0000 ug | Freq: Once | INTRAMUSCULAR | Status: DC

## 2021-02-24 MED ORDER — OXYCODONE-ACETAMINOPHEN 5-325 MG PO TABS: 1.0000 | ORAL_TABLET | ORAL | Status: DC | PRN

## 2021-02-24 MED ORDER — DIPHENHYDRAMINE HCL 25 MG PO CAPS: 25.0000 mg | ORAL_CAPSULE | Freq: Four times a day (QID) | ORAL | Status: DC | PRN

## 2021-02-24 MED ORDER — SENNOSIDES-DOCUSATE SODIUM 8.6-50 MG PO TABS
2.0000 | ORAL_TABLET | Freq: Every day | ORAL | Status: DC
  Administered 2021-02-25: 2 via ORAL
  Filled 2021-02-24: qty 2

## 2021-02-24 MED ORDER — ZOLPIDEM TARTRATE 5 MG PO TABS: 5.0000 mg | ORAL_TABLET | Freq: Every evening | ORAL | Status: DC | PRN

## 2021-02-24 MED ORDER — ONDANSETRON HCL 4 MG/2ML IJ SOLN: 4.0000 mg | Freq: Four times a day (QID) | INTRAMUSCULAR | Status: DC | PRN

## 2021-02-24 MED ORDER — COCONUT OIL OIL: 1.0000 "application " | TOPICAL_OIL | Status: DC | PRN

## 2021-02-24 MED ORDER — BENZOCAINE-MENTHOL 20-0.5 % EX AERO
1.0000 "application " | INHALATION_SPRAY | CUTANEOUS | Status: DC | PRN
  Administered 2021-02-25: 1 via TOPICAL
  Filled 2021-02-24: qty 56

## 2021-02-24 MED ORDER — DIBUCAINE (PERIANAL) 1 % EX OINT: 1.0000 "application " | TOPICAL_OINTMENT | CUTANEOUS | Status: DC | PRN

## 2021-02-24 MED ORDER — SIMETHICONE 80 MG PO CHEW: 80.0000 mg | CHEWABLE_TABLET | ORAL | Status: DC | PRN

## 2021-02-24 MED ORDER — ONDANSETRON HCL 4 MG PO TABS: 4.0000 mg | ORAL_TABLET | ORAL | Status: DC | PRN

## 2021-02-24 MED ORDER — IBUPROFEN 600 MG PO TABS
600.0000 mg | ORAL_TABLET | Freq: Four times a day (QID) | ORAL | Status: DC
  Administered 2021-02-24 – 2021-02-25 (×4): 600 mg via ORAL
  Filled 2021-02-24 (×4): qty 1

## 2021-02-24 MED ORDER — OXYTOCIN-SODIUM CHLORIDE 30-0.9 UT/500ML-% IV SOLN
2.5000 [IU]/h | INTRAVENOUS | Status: DC
  Administered 2021-02-24: 2.5 [IU]/h via INTRAVENOUS
  Filled 2021-02-24: qty 500

## 2021-02-24 MED ORDER — LACTATED RINGERS IV SOLN: 500.0000 mL | INTRAVENOUS | Status: DC | PRN

## 2021-02-24 MED ORDER — PRENATAL MULTIVITAMIN CH
1.0000 | ORAL_TABLET | Freq: Every day | ORAL | Status: DC
  Administered 2021-02-25: 1 via ORAL
  Filled 2021-02-24: qty 1

## 2021-02-24 MED ORDER — MEASLES, MUMPS & RUBELLA VAC IJ SOLR
0.5000 mL | Freq: Once | INTRAMUSCULAR | Status: AC
  Administered 2021-02-25: 0.5 mL via SUBCUTANEOUS
  Filled 2021-02-24: qty 0.5

## 2021-02-24 MED ORDER — LACTATED RINGERS IV SOLN: INTRAVENOUS | Status: DC

## 2021-02-24 MED ORDER — SOD CITRATE-CITRIC ACID 500-334 MG/5ML PO SOLN: 30.0000 mL | ORAL | Status: DC | PRN

## 2021-02-24 MED ORDER — TETANUS-DIPHTH-ACELL PERTUSSIS 5-2.5-18.5 LF-MCG/0.5 IM SUSY: 0.5000 mL | PREFILLED_SYRINGE | Freq: Once | INTRAMUSCULAR | Status: DC

## 2021-02-24 MED ORDER — LIDOCAINE HCL (PF) 1 % IJ SOLN
30.0000 mL | INTRAMUSCULAR | Status: DC | PRN
  Administered 2021-02-24: 30 mL via SUBCUTANEOUS
  Filled 2021-02-24: qty 30

## 2021-02-24 MED ORDER — WITCH HAZEL-GLYCERIN EX PADS: 1.0000 "application " | MEDICATED_PAD | CUTANEOUS | Status: DC | PRN

## 2021-02-24 MED ORDER — ONDANSETRON HCL 4 MG/2ML IJ SOLN: 4.0000 mg | INTRAMUSCULAR | Status: DC | PRN

## 2021-02-24 NOTE — Discharge Summary (Signed)
Postpartum Discharge Summary   Patient Name: Lindsey Bennett DOB: [birth year] MRN: [id number]  Date of admission: 02/24/2021 Delivery date:02/24/2021  Delivering provider: Patriciaann Clan  Date of discharge: 02/25/2021  Admitting diagnosis: Supervision of low-risk first pregnancy [Z34.00] Intrauterine pregnancy: [redacted]w[redacted]d     Secondary diagnosis:  Principal Problem:   Vaginal delivery Active Problems:   History of pre-eclampsia   Rubella non-immune status, antepartum  Additional problems: None   Discharge diagnosis: Term Pregnancy Delivered                                              Post partum procedures: None Augmentation: N/A Complications: None  Hospital course: Onset of Labor With Vaginal Delivery      20 y.o. yo [id number] at [redacted]w[redacted]d was admitted in Active Labor on 02/24/2021. Patient had an uncomplicated labor course as follows:  Membrane Rupture Time/Date: 9:45 AM ,02/24/2021   Delivery Method:Vaginal, Spontaneous  Episiotomy: None  Lacerations:  2nd degree;Perineal  Patient had an uncomplicated postpartum course.  She is ambulating, tolerating a regular diet, passing flatus, and urinating well. Patient is discharged home in stable condition on 02/25/21.  Newborn Data: Birth date:02/24/2021  Birth time:10:13 AM  Gender:Female  Living status:Living  Apgars:9 ,9  Weight:3419 g   Magnesium Sulfate received: No BMZ received: No Rhophylac:No MMR:Yes - will received prior to discharge T-DaP:Given prenatally Flu: N/A Transfusion:No  Physical exam  Vitals:   02/24/21 1800 02/24/21 2114 02/25/21 0023 02/25/21 0512  BP: 123/72 132/60 124/69 132/71  Pulse: 85 86 98 65  Resp: $Remo'18 16 18 16  'FNejG$ Temp: 99.1 F (37.3 C) 98.4 F (36.9 C) 98.1 F (36.7 C) 98.5 F (36.9 C)  TempSrc: Oral Oral Oral Oral  SpO2: 99% 97% 97% 99%   General: alert, cooperative, and no distress Lochia: appropriate Uterine Fundus: firm DVT Evaluation: no LE edema, no calf tenderness to  palpation  Labs: Lab Results  Component Value Date   WBC 14.0 (H) 02/25/2021   HGB 10.2 (L) 02/25/2021   HCT 31.7 (L) 02/25/2021   MCV 79.1 (L) 02/25/2021   PLT 314 02/25/2021   CMP Latest Ref Rng & Units 02/06/2021  Glucose 70 - 99 mg/dL 94  BUN 6 - 20 mg/dL 8  Creatinine 0.44 - 1.00 mg/dL 0.59  Sodium 135 - 145 mmol/L 133(L)  Potassium 3.5 - 5.1 mmol/L 3.9  Chloride 98 - 111 mmol/L 102  CO2 22 - 32 mmol/L 20(L)  Calcium 8.9 - 10.3 mg/dL 9.7  Total Protein 6.5 - 8.1 g/dL 6.6  Total Bilirubin 0.3 - 1.2 mg/dL 0.4  Alkaline Phos 38 - 126 U/L 152(H)  AST 15 - 41 U/L 28  ALT 0 - 44 U/L 16   Edinburgh Score: Edinburgh Postnatal Depression Scale Screening Tool 02/25/2021  I have been able to laugh and see the funny side of things. 0  I have looked forward with enjoyment to things. 0  I have blamed myself unnecessarily when things went wrong. 0  I have been anxious or worried for no good reason. 0  I have felt scared or panicky for no good reason. 0  Things have been getting on top of me. 0  I have been so unhappy that I have had difficulty sleeping. 0  I have felt sad or miserable. 0  I have been so unhappy that I have  been crying. 0  The thought of harming myself has occurred to me. 0  Edinburgh Postnatal Depression Scale Total 0     After visit meds:  Allergies as of 02/25/2021   No Known Allergies      Medication List     STOP taking these medications    aspirin 81 MG chewable tablet       TAKE these medications    acetaminophen 500 MG tablet Commonly known as: TYLENOL Take 2 tablets (1,000 mg total) by mouth every 8 (eight) hours as needed (pain). What changed:  medication strength how much to take when to take this reasons to take this   ibuprofen 200 MG tablet Commonly known as: ADVIL Take 2 tablets (400 mg total) by mouth every 6 (six) hours as needed (pain).   prenatal multivitamin Tabs tablet Take 1 tablet by mouth daily at 12 noon.          Discharge home in stable condition Infant Feeding: Breast Infant Disposition: home with mother Discharge instruction: per After Visit Summary and Postpartum booklet. Activity: Advance as tolerated. Pelvic rest for 6 weeks.  Diet: routine diet Future Appointments: Future Appointments  Date Time Provider [place]  02/27/2021 10:10 AM Leftwich-Kirby, Kathie Dike, CNM [facility]   Follow up Visit: Message sent by Dr. Higinio Plan on 02/24/2021. BP check added by Dr. Gwenlyn Perking on 8/21 due to occasional borderline BP postpartum. No symptoms. Will reassess in one week. Return precautions reviewed with patient.  Please schedule this patient for a In person postpartum visit in 4 weeks with the following provider: Any provider. Additional Postpartum F/U: BP check in 1 week  Low risk pregnancy complicated by:  None Delivery mode:  Vaginal, Spontaneous  Anticipated Birth Control:  Unsure  02/25/2021 Genia Del, MD OB Fellow  Faculty Practice

## 2021-02-24 NOTE — H&P (Addendum)
OBSTETRIC ADMISSION HISTORY AND PHYSICAL  Lindsey Bennett is a 20 y.o. female [phone] with IUP at [redacted]w[redacted]d by LMP presenting for SOL. She reports +FMs, No LOF, no VB, no blurry vision, headaches or peripheral edema, and RUQ pain.  She plans on breast feeding. She is undecided regarding methods for birth control. She received her prenatal care at  [facility]    Dating: By LMP --->  Estimated Date of Delivery: 03/02/21  Sono:    '@[redacted]w[redacted]d'$ , CWD, normal anatomy, transverse presentation,  256g, 32% EFW   Prenatal History/Complications: Rubella non-immune, hx of pre-eclampsia  Past Medical History: Past Medical History:  Diagnosis Date   Medical history non-contributory     Past Surgical History: Past Surgical History:  Procedure Laterality Date   NO PAST SURGERIES     WISDOM TOOTH EXTRACTION Bilateral 2017    Obstetrical History: OB History     Gravida  4   Para  1   Term  1   Preterm  0   AB  2   Living  1      SAB  2   IAB  0   Ectopic  0   Multiple  0   Live Births  1           Social History Social History   Socioeconomic History   Marital status: Married    Spouse name: Not on file   Number of children: Not on file   Years of education: Not on file   Highest education level: Not on file  Occupational History   Not on file  Tobacco Use   Smoking status: Never   Smokeless tobacco: Never  Vaping Use   Vaping Use: Never used  Substance and Sexual Activity   Alcohol use: No   Drug use: No   Sexual activity: Yes    Birth control/protection: None  Other Topics Concern   Not on file  Social History Narrative   Not on file   Social Determinants of Health   Financial Resource Strain: Not on file  Food Insecurity: Not on file  Transportation Needs: Not on file  Physical Activity: Not on file  Stress: Not on file  Social Connections: Not on file    Family History: Family History  Problem Relation Age of Onset   Obesity Neg Hx     Hypertension Neg Hx    Heart disease Neg Hx    Diabetes Neg Hx     Allergies: No Known Allergies  Medications Prior to Admission  Medication Sig Dispense Refill Last Dose   aspirin 81 MG chewable tablet Chew 1 tablet (81 mg total) by mouth daily. Start at 13 weeks 60 tablet 3 02/23/2021   Prenatal Vit-Fe Fumarate-FA (PRENATAL MULTIVITAMIN) TABS tablet Take 1 tablet by mouth daily at 12 noon.   02/23/2021   acetaminophen (TYLENOL) 325 MG tablet Take 2 tablets (650 mg total) by mouth every 4 (four) hours as needed for up to 42 doses for fever or headache. (Patient not taking: No sig reported) 84 tablet 0      Review of Systems   All systems reviewed and negative except as stated in HPI  Blood pressure (!) 150/72, pulse 79, temperature 98.1 F (36.7 C), temperature source Oral, resp. rate 20, last menstrual period 05/26/2020, unknown if currently breastfeeding. General appearance: alert and moderate distress Lungs: normal work of breathing on room air Heart: regular rate and rhythm Abdomen: soft, non-tender; bowel sounds normal Pelvic: Normal external female genitalia  Extremities: Homans sign is negative, no sign of DVT Presentation: cephalic Fetal monitoringBaseline: 130 bpm, Variability: Good {> 6 bpm), and Accelerations: Reactive Uterine activitySpontaneous contractions 2-3 minutes apart Dilation: 6 Effacement (%): 90 Station: -1, -2 Exam by:: n druebbisch rn   Prenatal labs: ABO, Rh: A/RH(D) POSITIVE/-- (01/28 0844) Antibody: NO ANTIBODIES DETECTED (01/28 0844) Rubella: <0.90 (01/28 0844) RPR: NON-REACTIVE (06/03 0908)  HBsAg: NON-REACTIVE (01/28 0844)  HIV: NON-REACTIVE (06/03 0908)  GBS:   Negative  1 hr Glucola WNL Genetic screening  Declined Anatomy US Normal Female  Prenatal Transfer Tool  Maternal Diabetes: No Genetic Screening: Declined Maternal Ultrasounds/Referrals: Normal Fetal Ultrasounds or other Referrals:  None Maternal Substance Abuse:   No Significant Maternal Medications:  None Significant Maternal Lab Results: Group B Strep negative and Other: Rubella non-immune  No results found for this or any previous visit (from the past 24 hour(s)).  Patient Active Problem List   Diagnosis Date Noted   Supervision of low-risk first pregnancy 02/24/2021   Elevated BP without diagnosis of hypertension 02/13/2021   Chlamydia infection affecting pregnancy 08/11/2020   Rubella non-immune status, antepartum 08/07/2020   Supervision of other normal pregnancy, antepartum 08/04/2020   History of pre-eclampsia 08/04/2020   BMI 32.0-32.9,adult 04/05/2019    Assessment/Plan:  Lindsey Bennett is a 20 y.o. [id number] at [redacted]w[redacted]d here for Spontaneous onset of labor  #Labor:Rapidly progressing, expect delivery is imminent #Pain: IV Fentanyl, likely does not have time for epidural #FWB: Cat I #ID: GBS neg #MOF: Breast #MOC:Undecided, aware of options  #Circ:  No  #Rubella non-immune: MMR pp   Pearla Dubonnet, MD  02/24/2021, 9:39 AM  GME ATTESTATION:  I saw and evaluated the patient. I agree with the findings and the plan of care as documented in the resident's note.  Darrelyn Hillock, DO OB Fellow, [place] [facility] [place] 02/24/2021 7:34 PM

## 2021-02-24 NOTE — Lactation Note (Signed)
This note was copied from a baby's chart. Lactation Consultation Note  Patient Name: Boy Dynastee Brummell DJTTS'V Date: 02/24/2021 Reason for consult: L&D Initial assessment Age:22 hours  P2, Baby cueing.  Assisted with latching with ease. Lactation to follow up on MBU.  Maternal Data Does the patient have breastfeeding experience prior to this delivery?: Yes How long did the patient breastfeed?: 4 mos.   LATCH Score Latch: Grasps breast easily, tongue down, lips flanged, rhythmical sucking.  Audible Swallowing: A few with stimulation  Type of Nipple: Flat  Comfort (Breast/Nipple): Soft / non-tender  Hold (Positioning): Assistance needed to correctly position infant at breast and maintain latch.  LATCH Score: 7   Interventions Interventions: Assisted with latch;Skin to skin;Education   Consult Status Consult Status: Follow-up from L&D    Dahlia Byes [facility] 02/24/2021, 11:23 AM

## 2021-02-24 NOTE — MAU Note (Signed)
Lindsey Bennett is a 20 y.o. at [redacted]w[redacted]d here in MAU reporting: contractions started this morning, she reports they are every 4 minutes or less. No bleeding or LOF. Was 1.5cm at last exam. DFM today. Schedule for induction next weekend.   Onset of complaint: today

## 2021-02-25 LAB — CBC
HCT: 31.7 % — ABNORMAL LOW (ref 36.0–46.0)
Hemoglobin: 10.2 g/dL — ABNORMAL LOW (ref 12.0–15.0)
MCH: 25.4 pg — ABNORMAL LOW (ref 26.0–34.0)
MCHC: 32.2 g/dL (ref 30.0–36.0)
MCV: 79.1 fL — ABNORMAL LOW (ref 80.0–100.0)
Platelets: 314 10*3/uL (ref 150–400)
RBC: 4.01 MIL/uL (ref 3.87–5.11)
RDW: 14.7 % (ref 11.5–15.5)
WBC: 14 10*3/uL — ABNORMAL HIGH (ref 4.0–10.5)
nRBC: 0 % (ref 0.0–0.2)

## 2021-02-25 MED ORDER — IBUPROFEN 200 MG PO TABS: 400.0000 mg | ORAL_TABLET | Freq: Four times a day (QID) | ORAL | Status: DC | PRN

## 2021-02-25 MED ORDER — ACETAMINOPHEN 500 MG PO TABS: 1000.0000 mg | ORAL_TABLET | Freq: Three times a day (TID) | ORAL | Status: DC | PRN

## 2021-02-25 NOTE — Lactation Note (Signed)
This note was copied from a baby's chart. Lactation Consultation Note  Patient Name: Lindsey Bennett UUVOZ'D Date: 02/25/2021 Reason for consult: Follow-up assessment;Term Age:22 hours   P2 mother whose infant is now 63 hours old.  This is a term baby at 39+1 weeks.  Mother desires a discharge today.  Baby was swaddled and asleep when I arrived.  Reviewed baby's feeding progress since birth.  Mother reported he has been latching and coming on/off the breast at times.  She denies pain with latching.  Average duration for feedings are 10-15 minutes.  Reviewed breast feeding basics and discussed baby's age (22 hours) in relation to sleepiness.  Suggested feeding STS and offering colostrum drops prior to latching.  Discussed finger feeding and spoon feeding.  Offered to return for latch assistance as needed.  Mother has sensitivity to nipples but no breakdown; coconut oil provided.  RN updated.   Maternal Data Has patient been taught Hand Expression?: Yes Does the patient have breastfeeding experience prior to this delivery?: Yes How long did the patient breastfeed?: 4 months  Feeding Mother's Current Feeding Choice: Breast Milk  LATCH Score                    Lactation Tools Discussed/Used    Interventions Interventions: Education  Discharge    Consult Status Consult Status: Follow-up Date: 02/26/21 Follow-up type: In-patient    Lindsey Bennett R [name] 02/25/2021, 9:06 AM

## 2021-02-27 ENCOUNTER — Encounter: Payer: Medicaid Other | Admitting: Advanced Practice Midwife

## 2021-03-02 ENCOUNTER — Inpatient Hospital Stay (HOSPITAL_COMMUNITY): Payer: Medicaid Other

## 2021-03-02 ENCOUNTER — Ambulatory Visit (INDEPENDENT_AMBULATORY_CARE_PROVIDER_SITE_OTHER): Payer: Medicaid Other | Admitting: Certified Nurse Midwife

## 2021-03-02 ENCOUNTER — Inpatient Hospital Stay (HOSPITAL_COMMUNITY): Admission: AD | Admit: 2021-03-02 | Payer: Medicaid Other | Source: Home / Self Care | Admitting: Obstetrics

## 2021-03-02 ENCOUNTER — Other Ambulatory Visit: Payer: Self-pay

## 2021-03-02 ENCOUNTER — Encounter: Payer: Self-pay | Admitting: Certified Nurse Midwife

## 2021-03-02 VITALS — BP 113/63 | HR 92 | Ht 61.0 in | Wt 189.0 lb

## 2021-03-02 DIAGNOSIS — Z09 Encounter for follow-up examination after completed treatment for conditions other than malignant neoplasm: Secondary | ICD-10-CM

## 2021-03-02 DIAGNOSIS — O169 Unspecified maternal hypertension, unspecified trimester: Secondary | ICD-10-CM

## 2021-03-02 NOTE — Progress Notes (Signed)
Subjective:    Lindsey Bennett is a 20 y.o. female who presents for a BP check. She is 6 days postpartum following a SVD. Her pregnancy was complicated by gHTN with previous hx of PEC. She has no complaints today. She is not on BP meds.    Review of Systems Pertinent items are noted in HPI.   Objective:   BP 113/63   Pulse 92   Ht 5\' 1"  (1.549 m)   Wt 189 lb (85.7 kg)   Breastfeeding Yes   BMI 35.71 kg/m   General: alert, cooperative and no distress  Lungs:  Regular WOB  Heart:  Regular rate      Assessment:  S/p SVD gHTN: stable  Plan:  Return for postpartum visit as scheduled    , CNM  03/02/2021 10:12 AM

## 2021-03-02 NOTE — Progress Notes (Signed)
Pt has no complaints today.  

## 2021-03-06 ENCOUNTER — Telehealth (HOSPITAL_COMMUNITY): Payer: Self-pay | Admitting: *Deleted

## 2021-03-06 NOTE — Telephone Encounter (Signed)
Attempted Hospital Discharge Follow-Up Call.  Left voice mail requesting that patient return RN's phone call.  

## 2021-03-29 NOTE — Progress Notes (Signed)
    Post Partum Visit Note  Lindsey Bennett is a 20 y.o. [phone] female who presents for a postpartum visit. She is 5 weeks postpartum following a normal spontaneous vaginal delivery.  I have fully reviewed the prenatal and intrapartum course. The delivery was at 39.1 gestational weeks.  Anesthesia: none. Postpartum course has been unremarkable. Baby is doing well. Baby is feeding by both breast and bottle - Enfamil Gentle Ease . Bleeding no bleeding. Bowel function is normal. Bladder function is normal. Patient is not sexually active. Contraception method is none. Postpartum depression screening: negative.   The pregnancy intention screening data noted above was reviewed. Potential methods of contraception were discussed. The patient elected to proceed with No data recorded.   Health Maintenance Due  Topic Date Due   COVID-19 Vaccine (1) Never done   HPV VACCINES (1 - 2-dose series) Never done   INFLUENZA VACCINE  02/05/2021    The following portions of the patient's history were reviewed and updated as appropriate: allergies, current medications, past family history, past medical history, past social history, past surgical history, and problem list.  Review of Systems Pertinent items are noted in HPI.  Objective:  BP 133/64   Pulse 82   Resp 16   Ht 5' (1.524 m)   Wt 192 lb (87.1 kg)   Breastfeeding Yes Comment: Breast and formula  BMI 37.50 kg/m    General:  alert, cooperative, and appears stated age  Lungs: clear to auscultation bilaterally  Heart:  regular rate and rhythm, S1, S2 normal, no murmur, click, rub or gallop  Abdomen: soft, non-tender; bowel sounds normal; no masses,  no organomegaly   GU exam:  normal       Assessment:   1. Postpartum state  Normal postpartum exam.   Plan:   Essential components of care per ACOG recommendations:  1.  Mood and well being: Patient with negative depression screening today. Reviewed local resources for support.  -  Patient tobacco use? No.   - hx of drug use? No.    2. Infant care and feeding:  -Patient currently breastmilk feeding? No.  -Social determinants of health (SDOH) reviewed in EPIC. No concerns.  3. Sexuality, contraception and birth spacing - Patient does not want a pregnancy in the next year.  Desired family size is 3 children.  - Reviewed forms of contraception in tiered fashion. Patient desired no method today.   - Discussed birth spacing of 18 months  4. Sleep and fatigue -Encouraged family/partner/community support of 4 hrs of uninterrupted sleep to help with mood and fatigue  5. Physical Recovery  - Discussed patients delivery and complications. She describes her labor as good. - Patient had a C-section, no problems at delivery. Patient had a 2nd degree laceration. Perineal healing reviewed. Patient expressed understanding - Patient has urinary incontinence? No. - Patient is safe to resume physical and sexual activity  6.  Health Maintenance - HM due items addressed Yes - Last pap smear  Diagnosis  Date Value Ref Range Status  08/04/2020   Final   - Negative for Intraepithelial Lesions or Malignancy (NILM)  08/04/2020 - Benign reactive/reparative changes  Final   Pap smear not done at today's visit.  -Breast Cancer screening indicated? No.   7. Chronic Disease/Pregnancy Condition follow up: None  - PCP follow up   Jolene Guyett, Harolyn Rutherford, NP Center for [employer], [facility]

## 2021-03-30 ENCOUNTER — Encounter: Payer: Self-pay | Admitting: Obstetrics and Gynecology

## 2021-03-30 ENCOUNTER — Other Ambulatory Visit: Payer: Self-pay

## 2021-03-30 ENCOUNTER — Ambulatory Visit (INDEPENDENT_AMBULATORY_CARE_PROVIDER_SITE_OTHER): Payer: Medicaid Other | Admitting: Obstetrics and Gynecology

## 2022-01-02 NOTE — Progress Notes (Unsigned)
   New Patient Office Visit  Subjective:  Patient ID: Lindsey Bennett, female    DOB: 2000/11/20  Age: 21 y.o. MRN: [id number]  CC: No chief complaint on file.    HPI Lindsey Bennett presents to establish care.   Past Medical History:  Diagnosis Date   Medical history non-contributory     Past Surgical History:  Procedure Laterality Date   NO PAST SURGERIES     WISDOM TOOTH EXTRACTION Bilateral 2017    Family History  Problem Relation Age of Onset   Obesity Neg Hx    Hypertension Neg Hx    Heart disease Neg Hx    Diabetes Neg Hx     Social History   Socioeconomic History   Marital status: Married    Spouse name: Not on file   Number of children: Not on file   Years of education: Not on file   Highest education level: Not on file  Occupational History   Not on file  Tobacco Use   Smoking status: Never   Smokeless tobacco: Never  Vaping Use   Vaping Use: Never used  Substance and Sexual Activity   Alcohol use: No   Drug use: No   Sexual activity: Yes    Birth control/protection: None  Other Topics Concern   Not on file  Social History Narrative   Not on file   Social Determinants of Health   Financial Resource Strain: Not on file  Food Insecurity: Not on file  Transportation Needs: Not on file  Physical Activity: Not on file  Stress: Not on file  Social Connections: Not on file  Intimate Partner Violence: Not on file    ROS Review of Systems  Objective:   Today's Vitals: There were no vitals taken for this visit.  Physical Exam  Assessment & Plan:   1. Encounter to establish care ***   Outpatient Encounter Medications as of 01/03/2022  Medication Sig   acetaminophen (TYLENOL) 500 MG tablet Take 2 tablets (1,000 mg total) by mouth every 8 (eight) hours as needed (pain).   No facility-administered encounter medications on file as of 01/03/2022.    Follow-up: No follow-ups on file.   Thayer Ohm, DNP, APRN, FNP-BC [place]  [facility]

## 2022-01-03 ENCOUNTER — Ambulatory Visit (INDEPENDENT_AMBULATORY_CARE_PROVIDER_SITE_OTHER): Payer: Medicaid Other | Admitting: Medical-Surgical

## 2022-01-03 DIAGNOSIS — Z91199 Patient's noncompliance with other medical treatment and regimen due to unspecified reason: Secondary | ICD-10-CM

## 2022-01-03 DIAGNOSIS — Z7689 Persons encountering health services in other specified circumstances: Secondary | ICD-10-CM

## 2022-05-03 ENCOUNTER — Encounter: Payer: Self-pay | Admitting: *Deleted

## 2022-05-03 DIAGNOSIS — Z3480 Encounter for supervision of other normal pregnancy, unspecified trimester: Secondary | ICD-10-CM | POA: Insufficient documentation

## 2022-05-07 ENCOUNTER — Other Ambulatory Visit (HOSPITAL_COMMUNITY)
Admission: RE | Admit: 2022-05-07 | Discharge: 2022-05-07 | Disposition: A | Discharge status: Home / Self Care | Payer: Medicaid Other | Source: Ambulatory Visit | Attending: Obstetrics and Gynecology | Admitting: Obstetrics and Gynecology

## 2022-05-07 ENCOUNTER — Ambulatory Visit (INDEPENDENT_AMBULATORY_CARE_PROVIDER_SITE_OTHER): Payer: Medicaid Other | Admitting: Obstetrics and Gynecology

## 2022-05-07 ENCOUNTER — Ambulatory Visit (INDEPENDENT_AMBULATORY_CARE_PROVIDER_SITE_OTHER): Payer: Medicaid Other

## 2022-05-07 VITALS — BP 128/87 | HR 73 | Wt 204.0 lb

## 2022-05-07 DIAGNOSIS — Z23 Encounter for immunization: Secondary | ICD-10-CM | POA: Diagnosis not present

## 2022-05-07 DIAGNOSIS — Z3481 Encounter for supervision of other normal pregnancy, first trimester: Secondary | ICD-10-CM

## 2022-05-07 DIAGNOSIS — Z3480 Encounter for supervision of other normal pregnancy, unspecified trimester: Secondary | ICD-10-CM

## 2022-05-07 DIAGNOSIS — Z3A10 10 weeks gestation of pregnancy: Secondary | ICD-10-CM

## 2022-05-07 DIAGNOSIS — Z8759 Personal history of other complications of pregnancy, childbirth and the puerperium: Secondary | ICD-10-CM | POA: Diagnosis not present

## 2022-05-07 LAB — HEPATITIS C ANTIBODY: HCV Ab: NEGATIVE

## 2022-05-07 NOTE — Progress Notes (Signed)
Pt declines NIPT.

## 2022-05-07 NOTE — Progress Notes (Signed)
History:   Lindsey Bennett is a 21 y.o. [id number] at [redacted]w[redacted]d by LMP being seen today for her first obstetrical visit.  Her obstetrical history is significant for obesity, pre-eclampsia, and elevated A1c . Patient does intend to breast feed. Pregnancy history fully reviewed.  Patient reports no complaints.      HISTORY: OB History  Gravida Para Term Preterm AB Living  5 2 2  0 2 2  SAB IAB Ectopic Multiple Live Births  2 0 0 0 2    # Outcome Date GA Lbr Len/2nd Weight Sex Delivery Anes PTL Lv  5 Current           4 Term 02/24/21 [redacted]w[redacted]d 04:28 / 00:15 7 lb 8.6 oz (3.419 kg) M Vag-Spont Local  LIV     Name: CRUZ-AVILA,BOY Liliahna     Apgar1: 9  Apgar5: 9  3 Term 12/24/17 [redacted]w[redacted]d 05:55 / 00:50 6 lb 7.5 oz (2.935 kg) M Vag-Spont EPI  LIV     Name: CRUZ-AVILA,BOY Alissa     Apgar1: 8  Apgar5: 9  2 SAB 03/2017          1 SAB             Last pap smear was done 2022 and was normal  Past Medical History:  Diagnosis Date   Medical history non-contributory    Past Surgical History:  Procedure Laterality Date   NO PAST SURGERIES     WISDOM TOOTH EXTRACTION Bilateral 2017   Family History  Problem Relation Age of Onset   Obesity Neg Hx    Hypertension Neg Hx    Heart disease Neg Hx    Diabetes Neg Hx    Social History   Tobacco Use   Smoking status: Never   Smokeless tobacco: Never  Vaping Use   Vaping Use: Never used  Substance Use Topics   Alcohol use: No   Drug use: No   No Known Allergies Current Outpatient Medications on File Prior to Visit  Medication Sig Dispense Refill   acetaminophen (TYLENOL) 500 MG tablet Take 2 tablets (1,000 mg total) by mouth every 8 (eight) hours as needed (pain).     No current facility-administered medications on file prior to visit.    Review of Systems Pertinent items noted in HPI and remainder of comprehensive ROS otherwise negative.  Indications for early GDM screening (FBS, A1C, Random CBG, GTT) First-degree relative with diabetes  No BMI >30kg/m2 Yes Age > 35 No Previous birth of an infant weighing ?4000 g No Gestational diabetes mellitus in a previous pregnancy Yes Glycated hemoglobin ?5.7 percent (39 mmol/mol), impaired glucose tolerance, or impaired fasting glucose on previous testing No High-risk race/ethnicity (eg, African American, Latino, Native American, Asian American, Pacific Islander) Yes Previous stillbirth of unknown cause No Maternal birthweight > 9 lbs No History of cardiovascular disease No Hypertension or on therapy for hypertension No High-density lipoprotein cholesterol level <35 mg/dL (0.90 mmol/L) and/or a triglyceride level >250 mg/dL (2.82 mmol/L) No Polycystic ovary syndrome No Physical inactivity Yes Other clinical condition associated with insulin resistance (eg, severe obesity, acanthosis nigricans) No Current use of glucocorticoids No   Physical Exam:   Vitals:   05/07/22 1507  BP: 128/87  Pulse: 73  Weight: 204 lb (92.5 kg)    Patient informed that the ultrasound is considered a limited obstetric ultrasound and is not intended to be a complete ultrasound exam.  Patient also informed that the ultrasound is not being completed with the intent  of assessing for fetal or placental anomalies or any pelvic abnormalities.  Explained that the purpose of today's ultrasound is to assess for fetal heart rate.  Patient acknowledges the purpose of the exam and the limitations of the study. General: well-developed, well-nourished female in no acute distress  Breasts:  normal appearance, no masses or tenderness bilaterally, exam done in the presence of a chaperone.   Skin: normal coloration and turgor, no rashes  Neurologic: oriented, normal, negative, normal mood  Extremities: normal strength, tone, and muscle mass, ROM of all joints is normal  HEENT PERRLA, extraocular movement intact and sclera clear, anicteric  Neck supple and no masses  Cardiovascular: regular rate and rhythm  Respiratory:   no respiratory distress, normal breath sounds  Abdomen: soft, non-tender; bowel sounds normal; no masses,  no organomegaly  Pelvic: Deferred     Assessment:    Pregnancy: [id number] Patient Active Problem List   Diagnosis Date Noted   Prediabetes 05/08/2022   Supervision of other normal pregnancy, antepartum 05/03/2022   Chlamydia infection affecting pregnancy 08/11/2020   Rubella non-immune status, antepartum 08/07/2020   History of pre-eclampsia 08/04/2020   BMI 32.0-32.9,adult 04/05/2019     Plan:    1. Supervision of other normal pregnancy, antepartum  - Culture, OB Urine - GC/Chlamydia probe amp ([place])not at [facility] - Hepatitis C antibody - HIV Antibody (routine testing w rflx) - Obstetric panel - US OB Limited; Future - HgB A1c - Protein / creatinine ratio, urine - Comprehensive metabolic panel - Babyscripts Schedule Optimization  - Encouraged home BP log. She will bring this to her next visit.  - Start BASA 162 MG daily  - Needs early GTT  2. Encounter for supervision of other normal pregnancy in first trimester  - PANORAMA PRENATAL TEST FULL PANEL   Initial labs drawn. Continue prenatal vitamins. Problem list reviewed and updated. Genetic Screening discussed, Panorama and Horizon: requested. Ultrasound discussed; fetal anatomic survey: planned. Anticipatory guidance about prenatal visits given including labs, ultrasounds, and testing. Weight gain recommendations per IOM guidelines reviewed: underweight/BMI 18.5 or less > 28 - 40 lbs; normal weight/BMI 18.5 - 24.9 > 25 - 35 lbs; overweight/BMI 25 - 29.9 > 15 - 25 lbs; obese/BMI  30 or more > 11 - 20 lbs. Discussed usage of the Babyscripts app for more information about pregnancy, and to track blood pressures. Also discussed usage of virtual visits as additional source of managing and completing prenatal visits.  Patient was encouraged to use MyChart to review results, send requests, and have questions  addressed.   The nature of [place] for [facility] with multiple MDs and Advanced Practice Providers was explained to patient; also emphasized that residents, students are part of our team. Routine obstetric precautions reviewed. Encouraged to seek out care at our office or emergency room [facility] MAU preferred) for urgent and/or emergent concerns. No follow-ups on file.    Anselmo Reihl, Artist Pais, [place] for [employer], [place]

## 2022-05-08 ENCOUNTER — Encounter: Payer: Self-pay | Admitting: Obstetrics and Gynecology

## 2022-05-08 DIAGNOSIS — R7303 Prediabetes: Secondary | ICD-10-CM | POA: Insufficient documentation

## 2022-05-08 HISTORY — DX: Prediabetes: R73.03

## 2022-05-08 LAB — OBSTETRIC PANEL
Absolute Monocytes: 627 cells/uL (ref 200–950)
Antibody Screen: NOT DETECTED
Basophils Absolute: 34 cells/uL (ref 0–200)
Basophils Relative: 0.3 %
Eosinophils Absolute: 194 cells/uL (ref 15–500)
Eosinophils Relative: 1.7 %
HCT: 41.6 % (ref 35.0–45.0)
Hemoglobin: 13.6 g/dL (ref 11.7–15.5)
Hepatitis B Surface Ag: NONREACTIVE
Lymphs Abs: 2713 cells/uL (ref 850–3900)
MCH: 27.4 pg (ref 27.0–33.0)
MCHC: 32.7 g/dL (ref 32.0–36.0)
MCV: 83.7 fL (ref 80.0–100.0)
MPV: 9.7 fL (ref 7.5–12.5)
Monocytes Relative: 5.5 %
Neutro Abs: 7832 cells/uL — ABNORMAL HIGH (ref 1500–7800)
Neutrophils Relative %: 68.7 %
Platelets: 372 10*3/uL (ref 140–400)
RBC: 4.97 10*6/uL (ref 3.80–5.10)
RDW: 14 % (ref 11.0–15.0)
RPR Ser Ql: NONREACTIVE
Rubella: 1.61 Index
Total Lymphocyte: 23.8 %
WBC: 11.4 10*3/uL — ABNORMAL HIGH (ref 3.8–10.8)

## 2022-05-08 LAB — COMPREHENSIVE METABOLIC PANEL
AG Ratio: 1.6 (calc) (ref 1.0–2.5)
ALT: 11 U/L (ref 6–29)
AST: 15 U/L (ref 10–30)
Albumin: 4.5 g/dL (ref 3.6–5.1)
Alkaline phosphatase (APISO): 57 U/L (ref 31–125)
BUN: 8 mg/dL (ref 7–25)
CO2: 16 mmol/L — ABNORMAL LOW (ref 20–32)
Calcium: 9.4 mg/dL (ref 8.6–10.2)
Chloride: 104 mmol/L (ref 98–110)
Creat: 0.56 mg/dL (ref 0.50–0.96)
Globulin: 2.9 g/dL (calc) (ref 1.9–3.7)
Glucose, Bld: 77 mg/dL (ref 65–99)
Potassium: 4 mmol/L (ref 3.5–5.3)
Sodium: 138 mmol/L (ref 135–146)
Total Bilirubin: 0.2 mg/dL (ref 0.2–1.2)
Total Protein: 7.4 g/dL (ref 6.1–8.1)

## 2022-05-08 LAB — HEMOGLOBIN A1C
Hgb A1c MFr Bld: 5.7 % of total Hgb — ABNORMAL HIGH (ref <5.7)
Mean Plasma Glucose: 117 mg/dL
eAG (mmol/L): 6.5 mmol/L

## 2022-05-08 LAB — PROTEIN / CREATININE RATIO, URINE
Creatinine, Urine: 88 mg/dL (ref 20–275)
Protein/Creat Ratio: 80 mg/g creat (ref 24–184)
Protein/Creatinine Ratio: 0.08 mg/mg creat (ref 0.024–0.184)
Total Protein, Urine: 7 mg/dL (ref 5–24)

## 2022-05-08 LAB — GC/CHLAMYDIA PROBE AMP (~~LOC~~) NOT AT ARMC
Chlamydia: NEGATIVE
Comment: NEGATIVE
Comment: NORMAL
Neisseria Gonorrhea: NEGATIVE

## 2022-05-08 LAB — HEPATITIS C ANTIBODY: Hepatitis C Ab: NONREACTIVE

## 2022-05-08 LAB — HIV ANTIBODY (ROUTINE TESTING W REFLEX): HIV 1&2 Ab, 4th Generation: NONREACTIVE

## 2022-05-09 ENCOUNTER — Telehealth: Payer: Self-pay | Admitting: *Deleted

## 2022-05-09 LAB — CULTURE, OB URINE

## 2022-05-09 LAB — URINE CULTURE, OB REFLEX: Organism ID, Bacteria: NO GROWTH

## 2022-05-09 NOTE — Telephone Encounter (Signed)
Left patient an urgent message to call and schedule 2 hour GTT at 16 weeks, 06/13/2022 or after.

## 2022-05-09 NOTE — Telephone Encounter (Signed)
-----   Message from Lezlie Lye, NP sent at 05/08/2022  4:38 PM EDT ----- Lindsey Bennett.   Your Hgb A1c is elevated. This means that you have borderline Diabetes. It would be best if you did an early glucose test around 16 weeks. If this is normal we will still recommend a 2nd glucose test at 28 weeks. I will let the office know. Please let me know if you have any questions.

## 2022-06-13 ENCOUNTER — Other Ambulatory Visit (INDEPENDENT_AMBULATORY_CARE_PROVIDER_SITE_OTHER): Payer: Medicaid Other

## 2022-06-13 DIAGNOSIS — Z3480 Encounter for supervision of other normal pregnancy, unspecified trimester: Secondary | ICD-10-CM

## 2022-06-13 NOTE — Progress Notes (Signed)
Pt here for 2 hour GTT. Pt given lab orders and sent to lab.

## 2022-06-14 LAB — 2HR GTT W 1 HR, CARPENTER, 75 G
Glucose, 1 Hr, Gest: 152 mg/dL (ref 65–179)
Glucose, 2 Hr, Gest: 125 mg/dL (ref 65–152)
Glucose, Fasting, Gest: 80 mg/dL (ref 65–91)

## 2022-06-18 ENCOUNTER — Ambulatory Visit (INDEPENDENT_AMBULATORY_CARE_PROVIDER_SITE_OTHER): Payer: Medicaid Other | Admitting: Obstetrics and Gynecology

## 2022-06-18 VITALS — BP 121/79 | HR 83 | Wt 200.0 lb

## 2022-06-18 DIAGNOSIS — Z3480 Encounter for supervision of other normal pregnancy, unspecified trimester: Secondary | ICD-10-CM

## 2022-06-18 NOTE — Progress Notes (Signed)
   PRENATAL VISIT NOTE  Subjective:  Lindsey Bennett is a 21 y.o. [phone] at [redacted]w[redacted]d being seen today for ongoing prenatal care.  She is currently monitored for the following issues for this low-risk pregnancy and has BMI 32.0-32.9,adult; History of pre-eclampsia; Rubella non-immune status, antepartum; Chlamydia infection affecting pregnancy; Supervision of other normal pregnancy, antepartum; and Prediabetes on their problem list.  Patient reports no complaints.  Contractions: Not present. Vag. Bleeding: None.  Movement: Absent. Denies leaking of fluid.   The following portions of the patient's history were reviewed and updated as appropriate: allergies, current medications, past family history, past medical history, past social history, past surgical history and problem list.   Objective:   Vitals:   06/18/22 1513  BP: 121/79  Pulse: 83  Weight: 200 lb (90.7 kg)    Fetal Status: Fetal Heart Rate (bpm): 155   Movement: Absent     General:  Alert, oriented and cooperative. Patient is in no acute distress.  Skin: Skin is warm and dry. No rash noted.   Cardiovascular: Normal heart rate noted  Respiratory: Normal respiratory effort, no problems with respiration noted  Abdomen: Soft, gravid, appropriate for gestational age.  Pain/Pressure: Absent     Pelvic: Cervical exam deferred        Extremities: Normal range of motion.  Edema: None  Mental Status: Normal mood and affect. Normal behavior. Normal judgment and thought content.   Assessment and Plan:  Pregnancy: [id number] at [redacted]w[redacted]d   1. Supervision of other normal pregnancy, antepartum  Doing well Passed early 2 hour. Start BASA today. MFM [place] scheduled.   Preterm labor symptoms and general obstetric precautions including but not limited to vaginal bleeding, contractions, leaking of fluid and fetal movement were reviewed in detail with the patient. Please refer to After Visit Summary for other counseling recommendations.   No  follow-ups on file.  Future Appointments  Date Time Provider Department Center  07/16/2022  9:15 AM [facility] NURSE [facility]  07/16/2022  9:30 AM [facility] US2 [facility]    Venia Carbon, NP

## 2022-07-08 NOTE — L&D Delivery Note (Signed)
Patient: Lindsey Bennett MRN: [id number]  GBS status: negative  Patient is a 22 y.o. now G5P3 s/p NSVD at [redacted]w[redacted]d, who was admitted for . DFM, BPP 6/10 in the office today and elevated blood pressures.  AROM 1h 5m prior to delivery with clear fluid.    Delivery Note At 5:20 PM a viable and healthy female was delivered via Vaginal, Spontaneous (Presentation:   Occiput Anterior).  APGAR: , ; weight  .   Placenta status: Spontaneous, Intact.  Cord:   with the following complications:  none.   Head delivered precipitously with nurses in attendance OA. No nuchal cord present. Shoulder and body delivered in usual fashion. Infant with spontaneous cry, placed on mother's abdomen, dried and bulb suctioned. Cord clamped x 2 after 1-minute delay, and cut by family member. Cord blood drawn. Placenta delivered spontaneously with gentle cord traction. Fundus firm with massage and Pitocin. Perineum inspected and found to have no laceration. Anesthesia: None Episiotomy: None Lacerations: None Est. Blood Loss (mL): 100  Mom to postpartum.  Baby to Couplet care / Skin to Skin.  Scheryl Darter 11/20/2022, 5:39 PM

## 2022-07-16 ENCOUNTER — Other Ambulatory Visit: Payer: Self-pay | Admitting: *Deleted

## 2022-07-16 ENCOUNTER — Ambulatory Visit: Payer: Medicaid Other | Admitting: *Deleted

## 2022-07-16 ENCOUNTER — Encounter: Payer: Self-pay | Admitting: Obstetrics and Gynecology

## 2022-07-16 ENCOUNTER — Telehealth (INDEPENDENT_AMBULATORY_CARE_PROVIDER_SITE_OTHER): Payer: Medicaid Other | Admitting: Obstetrics and Gynecology

## 2022-07-16 ENCOUNTER — Ambulatory Visit: Payer: Medicaid Other | Attending: Maternal & Fetal Medicine

## 2022-07-16 VITALS — BP 126/58 | HR 77

## 2022-07-16 DIAGNOSIS — Z3480 Encounter for supervision of other normal pregnancy, unspecified trimester: Secondary | ICD-10-CM

## 2022-07-16 DIAGNOSIS — Z363 Encounter for antenatal screening for malformations: Secondary | ICD-10-CM | POA: Diagnosis not present

## 2022-07-16 DIAGNOSIS — Z3481 Encounter for supervision of other normal pregnancy, first trimester: Secondary | ICD-10-CM

## 2022-07-16 DIAGNOSIS — O99212 Obesity complicating pregnancy, second trimester: Secondary | ICD-10-CM

## 2022-07-16 DIAGNOSIS — Z8759 Personal history of other complications of pregnancy, childbirth and the puerperium: Secondary | ICD-10-CM

## 2022-07-16 DIAGNOSIS — Z3A20 20 weeks gestation of pregnancy: Secondary | ICD-10-CM

## 2022-07-16 DIAGNOSIS — O09299 Supervision of pregnancy with other poor reproductive or obstetric history, unspecified trimester: Secondary | ICD-10-CM

## 2022-07-16 DIAGNOSIS — O09292 Supervision of pregnancy with other poor reproductive or obstetric history, second trimester: Secondary | ICD-10-CM | POA: Insufficient documentation

## 2022-07-16 DIAGNOSIS — Z3482 Encounter for supervision of other normal pregnancy, second trimester: Secondary | ICD-10-CM

## 2022-07-16 NOTE — Progress Notes (Signed)
    TELEHEALTH OBSTETRICS VISIT ENCOUNTER NOTE  Provider location: [facility] [facility] [place]   Patient location: Home  I connected with Lindsey Bennett on 07/16/22 at  1:10 PM EST by telephone at home and verified that I am speaking with the correct person using two identifiers. Of note, unable to do video encounter due to technical difficulties.    I discussed the limitations, risks, security and privacy concerns of performing an evaluation and management service by telephone and the availability of in person appointments. I also discussed with the patient that there may be a patient responsible charge related to this service. The patient expressed understanding and agreed to proceed.  Subjective:  Lindsey Bennett is a 22 y.o. [phone] at [redacted]w[redacted]d being followed for ongoing prenatal care.  She is currently monitored for the following issues for this low-risk pregnancy and has BMI 32.0-32.9,adult; History of pre-eclampsia; Rubella non-immune status, antepartum; Chlamydia infection affecting pregnancy; Supervision of other normal pregnancy, antepartum; and Prediabetes on their problem list.  Patient reports no complaints. Reports fetal movement. Denies any contractions, bleeding or leaking of fluid.   The following portions of the patient's history were reviewed and updated as appropriate: allergies, current medications, past family history, past medical history, past social history, past surgical history and problem list.   Objective:  Blood pressure (!) 126/58, pulse 77, last menstrual period 02/21/2022, currently breastfeeding. General:  Alert, oriented and cooperative.   Mental Status: Normal mood and affect perceived. Normal judgment and thought content.  Rest of physical exam deferred due to type of encounter  Assessment and Plan:  Pregnancy: [id number] at [redacted]w[redacted]d 1. Supervision of other normal pregnancy, antepartum  [place] today, reported as normal.  F/u scheduled Doing  well. No concerned.  Early 2 hour normal. Scheduled 28 weeks GTT.    Preterm labor symptoms and general obstetric precautions including but not limited to vaginal bleeding, contractions, leaking of fluid and fetal movement were reviewed in detail with the patient.  I discussed the assessment and treatment plan with the patient. The patient was provided an opportunity to ask questions and all were answered. The patient agreed with the plan and demonstrated an understanding of the instructions. The patient was advised to call back or seek an in-person office evaluation/go to [facility] at [facility] for any urgent or concerning symptoms. Please refer to After Visit Summary for other counseling recommendations.   I provided 10 minutes of non-face-to-face time during this encounter.  Return in about 4 weeks (around 08/13/2022), or in person or virtual ..  Future Appointments  Date Time Provider [place]  08/13/2022  9:30 AM [facility] NURSE [facility]  08/13/2022  9:45 AM [facility] [place], NP [facility] [place]

## 2022-07-16 NOTE — Addendum Note (Signed)
Addended by: Noni Saupe I on: 07/16/2022 01:25 PM   Modules accepted: Level of Service

## 2022-08-01 ENCOUNTER — Encounter: Payer: Self-pay | Admitting: *Deleted

## 2022-08-01 ENCOUNTER — Ambulatory Visit
Admission: EM | Admit: 2022-08-01 | Discharge: 2022-08-01 | Disposition: A | Discharge status: Home / Self Care | Payer: Medicaid Other | Attending: Family Medicine | Admitting: Family Medicine

## 2022-08-01 DIAGNOSIS — Z3A23 23 weeks gestation of pregnancy: Secondary | ICD-10-CM

## 2022-08-01 DIAGNOSIS — J069 Acute upper respiratory infection, unspecified: Secondary | ICD-10-CM | POA: Diagnosis not present

## 2022-08-01 LAB — POC SARS CORONAVIRUS 2 AG -  ED: SARS Coronavirus 2 Ag: NEGATIVE

## 2022-08-01 NOTE — Discharge Instructions (Signed)
Your COVID test is negative You have a viral upper respiratory infection with cough I will give you a list of medicines that are approved for use in pregnancy Drinks lots of water Run a humidifier if you have 1 Call your primary care doctor or OB for questions

## 2022-08-01 NOTE — ED Provider Notes (Signed)
[facility]    CSN: [id number] Arrival date & time: 08/01/22  1056      History   Chief Complaint Chief Complaint  Patient presents with   Cough   Nasal Congestion    HPI Lindsey Bennett is a 22 y.o. female.   HPI  Patient is [redacted] weeks pregnant.  Pregnancy is going well She has 2 small children at home aged 31 and 1.  They are both sick with a virus.  They had COVID and flu testing.  They were negative.  She has come down with symptoms after them.  She has cough, congestion, fever.  Is concerned for her unborn, would like COVID testing.  Does not know what medicines to take while pregnant and is bothered by unremitting cough, congestion, runny nose  Past Medical History:  Diagnosis Date   Medical history non-contributory     Patient Active Problem List   Diagnosis Date Noted   Prediabetes 05/08/2022   Supervision of other normal pregnancy, antepartum 05/03/2022   Chlamydia infection affecting pregnancy 08/11/2020   Rubella non-immune status, antepartum 08/07/2020   History of pre-eclampsia 08/04/2020   BMI 32.0-32.9,adult 04/05/2019    Past Surgical History:  Procedure Laterality Date   NO PAST SURGERIES     WISDOM TOOTH EXTRACTION Bilateral 2017    OB History     Gravida  5   Para  2   Term  2   Preterm  0   AB  2   Living  2      SAB  2   IAB  0   Ectopic  0   Multiple  0   Live Births  2            Home Medications    Prior to Admission medications   Medication Sig Start Date End Date Taking? Authorizing Provider  aspirin EC 81 MG tablet Take 81 mg by mouth daily. Swallow whole.    [provider]  Prenatal Vit-Fe Fumarate-FA (MULTIVITAMIN-PRENATAL) 27-0.8 MG TABS tablet Take 1 tablet by mouth daily at 12 noon.    [provider]    Family History Family History  Problem Relation Age of Onset   Obesity Neg Hx    Hypertension Neg Hx    Heart disease Neg Hx    Diabetes Neg Hx    Asthma Neg Hx     Cancer Neg Hx     Social History Social History   Tobacco Use   Smoking status: Never   Smokeless tobacco: Never  Vaping Use   Vaping Use: Never used  Substance Use Topics   Alcohol use: No   Drug use: No     Allergies   Patient has no known allergies.   Review of Systems Review of Systems  See HPI Physical Exam Triage Vital Signs ED Triage Vitals  Enc Vitals Group     BP 08/01/22 1114 136/84     Pulse Rate 08/01/22 1114 100     Resp 08/01/22 1114 (!) 22     Temp 08/01/22 1114 98.8 F (37.1 C)     Temp Source 08/01/22 1114 Oral     SpO2 08/01/22 1114 98 %     Weight 08/01/22 1116 180 lb (81.6 kg)     Height 08/01/22 1116 5\' 1"  (1.549 m)     Head Circumference --      Peak Flow --      Pain Score --  Pain Loc --      Pain Edu? --      Excl. in [place]? --    No data found.  Updated Vital Signs BP 136/84 (BP Location: Left Arm)   Pulse 100   Temp 98.8 F (37.1 C) (Oral)   Resp (!) 22   Ht 5\' 1"  (1.549 m)   Wt 81.6 kg   LMP 02/21/2022   SpO2 98%   BMI 34.01 kg/m      Physical Exam Constitutional:      General: She is not in acute distress.    Appearance: She is well-developed. She is ill-appearing.  HENT:     Head: Normocephalic and atraumatic.     Right Ear: Tympanic membrane and ear canal normal.     Left Ear: Tympanic membrane and ear canal normal.     Nose: Congestion and rhinorrhea present.     Mouth/Throat:     Mouth: Mucous membranes are moist.     Pharynx: No posterior oropharyngeal erythema.  Eyes:     Conjunctiva/sclera: Conjunctivae normal.     Pupils: Pupils are equal, round, and reactive to light.  Cardiovascular:     Rate and Rhythm: Normal rate and regular rhythm.     Heart sounds: Normal heart sounds.  Pulmonary:     Effort: Pulmonary effort is normal. No respiratory distress.     Breath sounds: Normal breath sounds.  Abdominal:     General: There is no distension.     Comments: 23-week pregnant  Musculoskeletal:         General: Normal range of motion.     Cervical back: Normal range of motion.  Lymphadenopathy:     Cervical: No cervical adenopathy.  Skin:    General: Skin is warm and dry.  Neurological:     Mental Status: She is alert.  Psychiatric:        Mood and Affect: Mood normal.      UC Treatments / Results  Labs (all labs ordered are listed, but only abnormal results are displayed) Labs Reviewed  POC SARS CORONAVIRUS 2 AG -  ED    EKG   Radiology No results found.  Procedures Procedures (including critical care time)  Medications Ordered in UC Medications - No data to display  Initial Impression / Assessment and Plan / UC Course  I have reviewed the triage vital signs and the nursing notes.  Pertinent labs & imaging results that were available during my care of the patient were reviewed by me and considered in my medical decision making (see chart for details).      Patient has a viral URI.  She is given a list of medicines safe to take during her pregnancy Final Clinical Impressions(s) / UC Diagnoses   Final diagnoses:  Viral URI with cough  [redacted] weeks gestation of pregnancy     Discharge Instructions      Your COVID test is negative You have a viral upper respiratory infection with cough I will give you a list of medicines that are approved for use in pregnancy Drinks lots of water Run a humidifier if you have 1 Call your primary care doctor or OB for questions     ED Prescriptions   None    PDMP not reviewed this encounter.   Raylene Everts, MD 08/01/22 1310

## 2022-08-01 NOTE — ED Triage Notes (Addendum)
Patient c/o 3-4 days of wet cough, nasal congestion and temps in the 99s. Taking tylnol at home. Both of her children recently sick with respiratory virus and ear infections. Both negative for flu and covid. Currently [redacted] weeks gestation.

## 2022-08-13 ENCOUNTER — Ambulatory Visit: Payer: Medicaid Other | Attending: Obstetrics

## 2022-08-13 ENCOUNTER — Ambulatory Visit: Payer: Medicaid Other | Admitting: *Deleted

## 2022-08-13 ENCOUNTER — Telehealth (INDEPENDENT_AMBULATORY_CARE_PROVIDER_SITE_OTHER): Payer: Medicaid Other | Admitting: Certified Nurse Midwife

## 2022-08-13 ENCOUNTER — Other Ambulatory Visit: Payer: Self-pay | Admitting: *Deleted

## 2022-08-13 ENCOUNTER — Encounter: Payer: Self-pay | Admitting: *Deleted

## 2022-08-13 VITALS — BP 123/59

## 2022-08-13 VITALS — BP 123/59 | HR 86

## 2022-08-13 DIAGNOSIS — Z3A24 24 weeks gestation of pregnancy: Secondary | ICD-10-CM | POA: Diagnosis not present

## 2022-08-13 DIAGNOSIS — E669 Obesity, unspecified: Secondary | ICD-10-CM | POA: Diagnosis not present

## 2022-08-13 DIAGNOSIS — Z3689 Encounter for other specified antenatal screening: Secondary | ICD-10-CM

## 2022-08-13 DIAGNOSIS — O99212 Obesity complicating pregnancy, second trimester: Secondary | ICD-10-CM | POA: Diagnosis present

## 2022-08-13 DIAGNOSIS — Z8759 Personal history of other complications of pregnancy, childbirth and the puerperium: Secondary | ICD-10-CM

## 2022-08-13 DIAGNOSIS — O09292 Supervision of pregnancy with other poor reproductive or obstetric history, second trimester: Secondary | ICD-10-CM | POA: Insufficient documentation

## 2022-08-13 DIAGNOSIS — O99213 Obesity complicating pregnancy, third trimester: Secondary | ICD-10-CM

## 2022-08-13 DIAGNOSIS — Z3480 Encounter for supervision of other normal pregnancy, unspecified trimester: Secondary | ICD-10-CM

## 2022-08-13 DIAGNOSIS — O09299 Supervision of pregnancy with other poor reproductive or obstetric history, unspecified trimester: Secondary | ICD-10-CM

## 2022-08-13 NOTE — Progress Notes (Signed)
OBSTETRICS PRENATAL VIRTUAL VISIT ENCOUNTER NOTE  Provider location: [facility]   Patient location: Home  I connected with [name] on 08/13/22 at  1:10 PM EST by MyChart Video Encounter and verified that I am speaking with the correct person using two identifiers. I discussed the limitations, risks, security and privacy concerns of performing an evaluation and management service virtually and the availability of in person appointments. I also discussed with the patient that there may be a patient responsible charge related to this service. The patient expressed understanding and agreed to proceed. Subjective:  Lindsey Bennett is a 22 y.o. [phone] at [redacted]w[redacted]d being seen today for ongoing prenatal care.  She is currently monitored for the following issues for this low-risk pregnancy and has BMI 32.0-32.9,adult; History of pre-eclampsia; Rubella non-immune status, antepartum; Chlamydia infection affecting pregnancy; Supervision of other normal pregnancy, antepartum; and Prediabetes on their problem list.  Patient reports no complaints.  Contractions: Not present. Vag. Bleeding: None.  Movement: Present. Denies any leaking of fluid.   The following portions of the patient's history were reviewed and updated as appropriate: allergies, current medications, past family history, past medical history, past social history, past surgical history and problem list.   Objective:   Vitals:   08/13/22 1320  BP: (!) 123/59    Fetal Status:     Movement: Present     General:  Alert, oriented and cooperative. Patient is in no acute distress.  Respiratory: Normal respiratory effort, no problems with respiration noted  Mental Status: Normal mood and affect. Normal behavior. Normal judgment and thought content.  Rest of physical exam deferred due to type of encounter  Imaging: [place] MFM OB FOLLOW UP  Result Date:  08/13/2022 ----------------------------------------------------------------------  OBSTETRICS REPORT                       (Signed Final 08/13/2022 11:08 am) ---------------------------------------------------------------------- Patient Info  ID #:       [id number]                          D.O.B.:  2001/03/02 (22 yrs)  Name:       Lindsey Bennett                Visit Date: 08/13/2022 10:43 am ---------------------------------------------------------------------- Performed By  Attending:        Johnell Comings MD         Ref. Address:     [id number] [place], [place]  Performed By:     Milus Glazier,      [facility]  Referred By:      Willene Hatchet ---------------------------------------------------------------------- Orders  #  Description                           Code        Ordered By  1  [place] MFM OB FOLLOW UP                   [phone]    Peterson Ao ----------------------------------------------------------------------  #  Order #                     Accession #                Episode #  1  [id number]                   [id number]                 [id number] ---------------------------------------------------------------------- Indications  Obesity complicating pregnancy, second         O99.212  trimester (BMI 40)  Poor obstetric history: Previous               O09.299  preeclampsia / eclampsia/gestational HTN  [redacted] weeks gestation of pregnancy                Z3A.24 ---------------------------------------------------------------------- Fetal Evaluation  Num Of Fetuses:         1  Fetal Heart Rate(bpm):  155  Cardiac Activity:       Observed  Presentation:           Cephalic  Placenta:               Anterior Fundal  P. Cord  Insertion:      Previously visualized  Amniotic Fluid  AFI FV:      Within normal limits  AFI Sum(cm)     %Tile       Largest Pocket(cm)  17.01           64          5.87  RUQ(cm)       RLQ(cm)       LUQ(cm)        LLQ(cm)  3.24          2.1           5.87           5.8 ---------------------------------------------------------------------- Biometry  BPD:      60.1  mm     G. Age:  24w 4d         34  %    CI:        69.21   %    70 - 86                                                          FL/HC:  20.1   %    18.7 - 20.3  HC:      230.7  mm     G. Age:  25w 1d         44  %    HC/AC:      1.14        1.04 - 1.22  AC:      203.1  mm     G. Age:  24w 6d         48  %    FL/BPD:     77.0   %    71 - 87  FL:       46.3  mm     G. Age:  25w 3d         38  %    FL/AC:      22.8   %    20 - 24  HUM:      43.8  mm     G. Age:  26w 0d         77  %  LV:        4.5  mm  Est. FW:     768  gm    1 lb 11 oz      57  % ---------------------------------------------------------------------- OB History  Gravidity:    5         Term:   2 ---------------------------------------------------------------------- Gestational Age  LMP:           24w 5d        Date:  02/21/22                  EDD:   11/28/22  U/S Today:     25w 0d                                        EDD:   11/26/22  Best:          24w 5d     Det. By:  LMP  (02/21/22)          EDD:   11/28/22 ---------------------------------------------------------------------- Anatomy  Cranium:               Appears normal         Aortic Arch:            Appears normal  Cavum:                 Previously             Ductal Arch:            Appears normal                         visualized  Ventricles:            Appears normal         Diaphragm:              Appears normal  Choroid Plexus:        Previously seen        Stomach:                Appears normal, left  sided  Cerebellum:            Previously seen         Abdomen:                Appears normal  Posterior Fossa:       Previously seen        Abdominal Wall:         Appears nml (cord                                                                        insert, abd wall)  Nuchal Fold:           Previously seen        Cord Vessels:           Appears normal (3                                                                        vessel cord)  Face:                  Appears normal         Kidneys:                Appear normal                         (orbits and profile)  Lips:                  Previously seen        Bladder:                Appears normal  Thoracic:              Appears normal         Spine:                  Previously seen  Heart:                 Appears normal         Upper Extremities:      Previously seen                         (4CH, axis, and                         situs)  RVOT:                  Appears normal         Lower Extremities:      Previously seen  LVOT:                  Appears normal  Other:  Profile, DA, IVC/SVC, 3VTV, LVOT visualized. RVOT and 4ch not well          visualized. ---------------------------------------------------------------------- Cervix Uterus Adnexa  Right  Ovary  Within normal limits.  Left Ovary  Within normal limits. ---------------------------------------------------------------------- Comments  This patient was seen for a follow up exam as the views of  the fetal anatomy were unable to be fully visualized during  her last exam and due to maternal obesity with a BMI of 40.  She denies any problems since her last exam.  She was informed that the fetal growth and amniotic fluid  level appears appropriate for her gestational age.  The views of the fetal anatomy were visualized today.  There  were no obvious anomalies noted.  The views of the fetal  anatomy remain limited today due to maternal body habitus.  The limitations of ultrasound in the detection of all anomalies  was discussed.  A follow-up exam was scheduled  in 4 weeks. ----------------------------------------------------------------------                  Johnell Comings, MD Electronically Signed Final Report   08/13/2022 11:08 am ----------------------------------------------------------------------  [place] MFM OB DETAIL +14 WK  Result Date: 07/16/2022 ----------------------------------------------------------------------  OBSTETRICS REPORT                       (Signed Final 07/16/2022 02:21 pm) ---------------------------------------------------------------------- Patient Info  ID #:       [id number]                          D.O.B.:  Feb 16, 2001 (21 yrs)  Name:       Lindsey Bennett                Visit Date: 07/16/2022 07:23 am ---------------------------------------------------------------------- Performed By  Attending:        Johnell Comings MD         Ref. Address:     [id number] [place], [place]  Performed By:     Stephenie Acres        Location[facility]                                  [facility] at                                                             [place] [facility]                                                             [facility]  Referred By:      Willene Hatchet ---------------------------------------------------------------------- Orders  #  Description                           Code        Ordered By  1  [place] MFM OB DETAIL +14 WK               [id number]    JENNIFER [facility] ----------------------------------------------------------------------  #  Order #                     Accession #                Episode #  1  [id number]                   [id number]                 [id number] ---------------------------------------------------------------------- Indications  Obesity complicating pregnancy, second         O99.212  trimester (BMI 40)  Poor obstetric history: Previous               O09.299  preeclampsia / eclampsia/gestational HTN  [redacted] weeks gestation of  pregnancy                Z3A.20  Encounter for antenatal screening for          Z36.3  malformations ---------------------------------------------------------------------- Fetal Evaluation  Num Of Fetuses:         1  Fetal Heart Rate(bpm):  152  Cardiac Activity:       Observed  Presentation:           Variable  Placenta:               Anterior Fundal  P. Cord Insertion:      Visualized  Amniotic Fluid  AFI FV:      Within normal limits                              Largest Pocket(cm)                              4.47 ---------------------------------------------------------------------- Biometry  BPD:      46.8  mm     G. Age:  20w 1d         26  %    CI:        73.39   %    70 - 86                                                          FL/HC:      18.9   %    15.9 - 20.3  HC:      173.6  mm     G. Age:  19w 6d         11  %    HC/AC:      1.00        1.06 - 1.25  AC:      173.6  mm     G. Age:  22w 2d         88  %    FL/BPD:     70.1   %  FL:       32.8  mm     G. Age:  20w 2d         26  %    FL/AC:      18.9   %    20 - 24  HUM:      33.2  mm     G. Age:  21w 1d         62  %  CER:      19.7  mm     G. Age:  19w 1d         14  %  NFT:       4.0  mm  LV:        6.2  mm  CM:        5.3  mm  Est. FW:     405  gm    0 lb 14 oz      71  % ---------------------------------------------------------------------- OB History  Gravidity:    5         Term:   2 ---------------------------------------------------------------------- Gestational Age  LMP:           20w 5d        Date:  02/21/22                  EDD:   11/28/22  U/S Today:     20w 5d                                        EDD:   11/28/22  Best:          20w 5d     Det. By:  LMP  (02/21/22)          EDD:   11/28/22 ---------------------------------------------------------------------- Anatomy  Cranium:               Appears normal         Aortic Arch:            Appears normal  Cavum:                 Appears normal         Ductal Arch:            Not well  visualized  Ventricles:            Appears normal         Diaphragm:              Appears normal  Choroid Plexus:        Appears normal         Stomach:                Appears normal, left                                                                        sided  Cerebellum:            Appears normal         Abdomen:  Appears normal  Posterior Fossa:       Appears normal         Abdominal Wall:         Appears nml (cord                                                                        insert, abd wall)  Nuchal Fold:           Not applicable (>94    Cord Vessels:           Appears normal ([redacted]                         wks GA)                                        vessel cord)  Face:                  Orbits nl; profile not Kidneys:                Appear normal                         well visualized  Lips:                  Not well visualized    Bladder:                Appears normal  Thoracic:              Appears normal         Spine:                  Appears normal  Heart:                 Not well visualized    Upper Extremities:      Appears normal  RVOT:                  Not well visualized    Lower Extremities:      Appears normal  LVOT:                  Not well visualized  Other:  LT Heel, feet,hands, nasal bone, lenses, maxilla, mandible, falx, VC          and 3VV visualized. Fetus appears to be a female. Technically difficult          due to maternal habitus and fetal position. ---------------------------------------------------------------------- Cervix Uterus Adnexa  Cervix  Length:            3.8  cm.  Right Ovary  Size(cm)     2.19   x   1.76   x  1.1       Vol(ml): 2.22  Left Ovary  Size(cm)     2.55   x   2.62   x  1.75      Vol(ml): 6.12 ---------------------------------------------------------------------- Comments  This patient was seen for a detailed fetal anatomy scan due  to maternal obesity with  a BMI of 40.  She denies any significant past medical history and denies  any  problems in her current pregnancy.  She has declined all screening tests for fetal aneuploidy in  her pregnancy.  She was informed that the fetal growth and amniotic fluid  level were appropriate for her gestational age.  There were no obvious fetal anomalies noted on today's  ultrasound exam.  However, the views of the fetal anatomy  were limited today due to the fetal position and maternal body  habitus.  The patient was informed that anomalies may be missed due  to technical limitations. If the fetus is in a suboptimal position  or maternal habitus is increased, visualization of the fetus in  the maternal uterus may be impaired.  Due to maternal obesity, we will continue to follow her with  frequent growth ultrasounds throughout her pregnancy.  Weekly fetal testing should be started at around 34 weeks.  A follow-up exam was scheduled in 4 weeks to complete the  views of the fetal anatomy. ----------------------------------------------------------------------                  Johnell Comings, MD Electronically Signed Final Report   07/16/2022 02:21 pm ----------------------------------------------------------------------   Assessment and Plan:  Pregnancy: [id number] at [redacted]w[redacted]d 1. Supervision of other normal pregnancy, antepartum  2. [redacted] weeks gestation of pregnancy  3. History of pre-eclampsia - continue bASA - weekly home BPs   Preterm labor symptoms and general obstetric precautions including but not limited to vaginal bleeding, contractions, leaking of fluid and fetal movement were reviewed in detail with the patient. I discussed the assessment and treatment plan with the patient. The patient was provided an opportunity to ask questions and all were answered. The patient agreed with the plan and demonstrated an understanding of the instructions. The patient was advised to call back or seek an in-person office evaluation/go to MAU at [facility] for any urgent or concerning symptoms. Please  refer to After Visit Summary for other counseling recommendations.   I provided 7 minutes of face-to-face time during this encounter.  Return in about 4 weeks (around 09/10/2022).  Future Appointments  Date Time Provider [place]  09/11/2022 10:45 AM [facility] NURSE [facility]  09/11/2022 11:00 AM [facility] US1 [facility] [place], [place] for [employer], [place]

## 2022-09-04 NOTE — Progress Notes (Unsigned)
   PRENATAL VISIT NOTE  Subjective:  Emilene Starrett is a 22 y.o. [phone] at 74w0dbeing seen today for ongoing prenatal care.  She is currently monitored for the following issues for this low-risk pregnancy and has BMI 32.0-32.9,adult; History of pre-eclampsia; Rubella non-immune status, antepartum; Supervision of other normal pregnancy, antepartum; and Prediabetes on their problem list.  Patient reports no complaints.  Contractions: Not present. Vag. Bleeding: None.  Movement: Present. Denies leaking of fluid.   The following portions of the patient's history were reviewed and updated as appropriate: allergies, current medications, past family history, past medical history, past social history, past surgical history and problem list.   Objective:   Vitals:   09/05/22 0908  BP: (!) 130/58  Pulse: 85  Weight: 214 lb (97.1 kg)    Fetal Status: Fetal Heart Rate (bpm): 158 Fundal Height: 29 cm Movement: Present     General:  Alert, oriented and cooperative. Patient is in no acute distress.  Skin: Skin is warm and dry. No rash noted.   Cardiovascular: Normal heart rate noted  Respiratory: Normal respiratory effort, no problems with respiration noted  Abdomen: Soft, gravid, appropriate for gestational age.  Pain/Pressure: Absent     Pelvic: Cervical exam deferred        Extremities: Normal range of motion.  Edema: None  Mental Status: Normal mood and affect. Normal behavior. Normal judgment and thought content.   Assessment and Plan:  Pregnancy: [id number] [redacted]w[redacted]d. History of pre-eclampsia BP today is normal Continue ldASA  2. Rubella non-immune status, antepartum MMR after delivery  3. Supervision of other normal pregnancy, antepartum 28w labs today Rh pos Recommended tdap - pt accepts MOC: Undecided. She knows she would like something but unsure what. She isn't sure if/when she would want another baby.   4. Prediabetes Early 2 hr was normal. Check 28w labs today Has  another growth on 3/6.   Preterm labor symptoms and general obstetric precautions including but not limited to vaginal bleeding, contractions, leaking of fluid and fetal movement were reviewed in detail with the patient. Please refer to After Visit Summary for other counseling recommendations.   Return in about 2 weeks (around 09/19/2022) for OB VISIT, MD or APP.  Future Appointments  Date Time Provider [place]/12/2022 10:45 AM [facility]12/2022 11:00 AM [facility] US1 [facility]  PaRadene GunningMD

## 2022-09-05 ENCOUNTER — Ambulatory Visit (INDEPENDENT_AMBULATORY_CARE_PROVIDER_SITE_OTHER): Payer: Medicaid Other | Admitting: Obstetrics and Gynecology

## 2022-09-05 ENCOUNTER — Encounter: Payer: Self-pay | Admitting: Obstetrics and Gynecology

## 2022-09-05 VITALS — BP 130/58 | HR 85 | Wt 214.0 lb

## 2022-09-05 DIAGNOSIS — O98813 Other maternal infectious and parasitic diseases complicating pregnancy, third trimester: Secondary | ICD-10-CM

## 2022-09-05 DIAGNOSIS — O09893 Supervision of other high risk pregnancies, third trimester: Secondary | ICD-10-CM

## 2022-09-05 DIAGNOSIS — Z3A28 28 weeks gestation of pregnancy: Secondary | ICD-10-CM

## 2022-09-05 DIAGNOSIS — Z23 Encounter for immunization: Secondary | ICD-10-CM

## 2022-09-05 DIAGNOSIS — Z3480 Encounter for supervision of other normal pregnancy, unspecified trimester: Secondary | ICD-10-CM

## 2022-09-05 DIAGNOSIS — O09899 Supervision of other high risk pregnancies, unspecified trimester: Secondary | ICD-10-CM

## 2022-09-05 DIAGNOSIS — R7303 Prediabetes: Secondary | ICD-10-CM

## 2022-09-05 DIAGNOSIS — A749 Chlamydial infection, unspecified: Secondary | ICD-10-CM

## 2022-09-05 DIAGNOSIS — Z8759 Personal history of other complications of pregnancy, childbirth and the puerperium: Secondary | ICD-10-CM

## 2022-09-06 LAB — CBC
Hematocrit: 36 % (ref 34.0–46.6)
Hemoglobin: 11.9 g/dL (ref 11.1–15.9)
MCH: 26.6 pg (ref 26.6–33.0)
MCHC: 33.1 g/dL (ref 31.5–35.7)
MCV: 81 fL (ref 79–97)
Platelets: 321 10*3/uL (ref 150–450)
RBC: 4.47 x10E6/uL (ref 3.77–5.28)
RDW: 12.8 % (ref 11.7–15.4)
WBC: 10.3 10*3/uL (ref 3.4–10.8)

## 2022-09-06 LAB — GLUCOSE TOLERANCE, 2 HOURS W/ 1HR
Glucose, 1 hour: 123 mg/dL (ref 70–179)
Glucose, 2 hour: 92 mg/dL (ref 70–152)
Glucose, Fasting: 81 mg/dL (ref 70–91)

## 2022-09-06 LAB — RPR: RPR Ser Ql: NONREACTIVE

## 2022-09-06 LAB — HIV ANTIBODY (ROUTINE TESTING W REFLEX): HIV Screen 4th Generation wRfx: NONREACTIVE

## 2022-09-11 ENCOUNTER — Ambulatory Visit: Payer: Medicaid Other | Attending: Obstetrics

## 2022-09-11 ENCOUNTER — Ambulatory Visit: Payer: Medicaid Other | Admitting: *Deleted

## 2022-09-11 ENCOUNTER — Other Ambulatory Visit: Payer: Self-pay | Admitting: *Deleted

## 2022-09-11 VITALS — BP 120/62 | HR 85

## 2022-09-11 DIAGNOSIS — Z3689 Encounter for other specified antenatal screening: Secondary | ICD-10-CM | POA: Insufficient documentation

## 2022-09-11 DIAGNOSIS — E669 Obesity, unspecified: Secondary | ICD-10-CM

## 2022-09-11 DIAGNOSIS — Z3A28 28 weeks gestation of pregnancy: Secondary | ICD-10-CM

## 2022-09-11 DIAGNOSIS — O09293 Supervision of pregnancy with other poor reproductive or obstetric history, third trimester: Secondary | ICD-10-CM | POA: Diagnosis not present

## 2022-09-11 DIAGNOSIS — O99213 Obesity complicating pregnancy, third trimester: Secondary | ICD-10-CM

## 2022-09-11 DIAGNOSIS — O09299 Supervision of pregnancy with other poor reproductive or obstetric history, unspecified trimester: Secondary | ICD-10-CM

## 2022-09-17 NOTE — Progress Notes (Deleted)
   PRENATAL VISIT NOTE  Subjective:  Lindsey Bennett is a 22 y.o. [phone] at 30w***d being seen today for ongoing prenatal care.  She is currently monitored for the following issues for this high-risk pregnancy and has BMI 32.0-32.9,adult; History of pre-eclampsia; Rubella non-immune status, antepartum; Supervision of other normal pregnancy, antepartum; and Prediabetes on their problem list.  Patient reports {[id number]}.   .  .   . Denies leaking of fluid.   The following portions of the patient's history were reviewed and updated as appropriate: allergies, current medications, past family history, past medical history, past social history, past surgical history and problem list.   Objective:  There were no vitals filed for this visit.  Fetal Status:           General:  Alert, oriented and cooperative. Patient is in no acute distress.  Skin: Skin is warm and dry. No rash noted.   Cardiovascular: Normal heart rate noted  Respiratory: Normal respiratory effort, no problems with respiration noted  Abdomen: Soft, gravid, appropriate for gestational age.        Pelvic: Cervical exam deferred        Extremities: Normal range of motion.     Mental Status: Normal mood and affect. Normal behavior. Normal judgment and thought content.   Assessment and Plan:  Pregnancy: [id number] at 30w***d 1. History of pre-eclampsia ldASA BP today ***  2. Supervision of other normal pregnancy, antepartum 35%ile on 3/6. Normal AFI. Normal AC.   3. Prediabetes 2 hr was normal  Preterm labor symptoms and general obstetric precautions including but not limited to vaginal bleeding, contractions, leaking of fluid and fetal movement were reviewed in detail with the patient. Please refer to After Visit Summary for other counseling recommendations.   No follow-ups on file.  Future Appointments  Date Time Provider [place]  09/19/2022 10:10 AM Radene Gunning, MD [facility]  10/23/2022  3:30 PM  [facility]SE [facility]  10/23/2022  3:45 PM [facility] US5 [facility]    Radene Gunning, MD

## 2022-09-19 ENCOUNTER — Encounter: Payer: Medicaid Other | Admitting: Obstetrics and Gynecology

## 2022-09-19 DIAGNOSIS — Z3480 Encounter for supervision of other normal pregnancy, unspecified trimester: Secondary | ICD-10-CM

## 2022-09-19 DIAGNOSIS — O09899 Supervision of other high risk pregnancies, unspecified trimester: Secondary | ICD-10-CM

## 2022-09-19 DIAGNOSIS — R7303 Prediabetes: Secondary | ICD-10-CM

## 2022-09-19 DIAGNOSIS — Z8759 Personal history of other complications of pregnancy, childbirth and the puerperium: Secondary | ICD-10-CM

## 2022-09-24 ENCOUNTER — Ambulatory Visit (INDEPENDENT_AMBULATORY_CARE_PROVIDER_SITE_OTHER): Payer: Medicaid Other | Admitting: Certified Nurse Midwife

## 2022-09-24 VITALS — BP 119/71 | HR 89 | Wt 215.0 lb

## 2022-09-24 DIAGNOSIS — Z3480 Encounter for supervision of other normal pregnancy, unspecified trimester: Secondary | ICD-10-CM

## 2022-09-24 DIAGNOSIS — Z3483 Encounter for supervision of other normal pregnancy, third trimester: Secondary | ICD-10-CM

## 2022-09-24 DIAGNOSIS — O99213 Obesity complicating pregnancy, third trimester: Secondary | ICD-10-CM

## 2022-09-24 DIAGNOSIS — O99210 Obesity complicating pregnancy, unspecified trimester: Secondary | ICD-10-CM | POA: Insufficient documentation

## 2022-09-24 DIAGNOSIS — Z8759 Personal history of other complications of pregnancy, childbirth and the puerperium: Secondary | ICD-10-CM

## 2022-09-24 DIAGNOSIS — Z3A30 30 weeks gestation of pregnancy: Secondary | ICD-10-CM

## 2022-09-24 NOTE — Progress Notes (Signed)
Subjective:  Lindsey Bennett is a 22 y.o. [phone] at [redacted]w[redacted]d being seen today for ongoing prenatal care.  She is currently monitored for the following issues for this low-risk pregnancy and has BMI 32.0-32.9,adult; History of pre-eclampsia; Supervision of other normal pregnancy, antepartum; Prediabetes; and Obesity affecting pregnancy on their problem list.  Patient reports no complaints.  Contractions: Not present. Vag. Bleeding: None.  Movement: Present. Denies leaking of fluid.   The following portions of the patient's history were reviewed and updated as appropriate: allergies, current medications, past family history, past medical history, past social history, past surgical history and problem list. Problem list updated.  Objective:   Vitals:   09/24/22 1554  BP: 119/71  Pulse: 89  Weight: 215 lb (97.5 kg)    Fetal Status: Fetal Heart Rate (bpm): 154 Fundal Height: 33 cm Movement: Present  Presentation: Undeterminable  General:  Alert, oriented and cooperative. Patient is in no acute distress.  Skin: Skin is warm and dry. No rash noted.   Cardiovascular: Normal heart rate noted  Respiratory: Normal respiratory effort, no problems with respiration noted  Abdomen: Soft, gravid, appropriate for gestational age. Pain/Pressure: Absent     Pelvic: Vag. Bleeding: None Vag D/C Character: Thin   Cervical exam deferred        Extremities: Normal range of motion.  Edema: None  Mental Status: Normal mood and affect. Normal behavior. Normal judgment and thought content.   Urinalysis:      Assessment and Plan:  Pregnancy: [id number] at [redacted]w[redacted]d  1. Supervision of other normal pregnancy, antepartum  2. Obesity affecting pregnancy in third trimester, unspecified obesity type - growth [place] scheduled, last [place] 35%ile  3. History of pre-eclampsia - taking bASA - monitor BP at home, reminded to upload to baby Rx  4. [redacted] weeks gestation of pregnancy  Preterm labor symptoms and general obstetric  precautions including but not limited to vaginal bleeding, contractions, leaking of fluid and fetal movement were reviewed in detail with the patient. Please refer to After Visit Summary for other counseling recommendations.  Return in about 3 weeks (around 10/15/2022).   Julianne Handler, CNM

## 2022-10-15 ENCOUNTER — Ambulatory Visit (INDEPENDENT_AMBULATORY_CARE_PROVIDER_SITE_OTHER): Payer: Medicaid Other

## 2022-10-15 VITALS — BP 123/78 | HR 102 | Wt 219.0 lb

## 2022-10-15 DIAGNOSIS — Z3A33 33 weeks gestation of pregnancy: Secondary | ICD-10-CM

## 2022-10-15 DIAGNOSIS — Z8759 Personal history of other complications of pregnancy, childbirth and the puerperium: Secondary | ICD-10-CM

## 2022-10-15 DIAGNOSIS — O99213 Obesity complicating pregnancy, third trimester: Secondary | ICD-10-CM

## 2022-10-15 DIAGNOSIS — Z3483 Encounter for supervision of other normal pregnancy, third trimester: Secondary | ICD-10-CM

## 2022-10-15 DIAGNOSIS — Z3480 Encounter for supervision of other normal pregnancy, unspecified trimester: Secondary | ICD-10-CM

## 2022-10-15 NOTE — Progress Notes (Signed)
   PRENATAL VISIT NOTE  Subjective:  Elky Guillory is a 22 y.o. [phone] at [redacted]w[redacted]d being seen today for ongoing prenatal care.  She is currently monitored for the following issues for this low-risk pregnancy and has BMI 32.0-32.9,adult; History of pre-eclampsia; Supervision of other normal pregnancy, antepartum; Prediabetes; and Obesity affecting pregnancy on their problem list.  Patient reports nausea.  Contractions: Not present. Vag. Bleeding: None.  Movement: Present. Denies leaking of fluid.   The following portions of the patient's history were reviewed and updated as appropriate: allergies, current medications, past family history, past medical history, past social history, past surgical history and problem list.   Objective:   Vitals:   10/15/22 1011  BP: 123/78  Pulse: (!) 102  Weight: 219 lb (99.3 kg)    Fetal Status: Fetal Heart Rate (bpm): 156 Fundal Height: 36 cm Movement: Present     General:  Alert, oriented and cooperative. Patient is in no acute distress.  Skin: Skin is warm and dry. No rash noted.   Cardiovascular: Normal heart rate noted  Respiratory: Normal respiratory effort, no problems with respiration noted  Abdomen: Soft, gravid, appropriate for gestational age.  Pain/Pressure: Absent     Pelvic: Cervical exam deferred        Extremities: Normal range of motion.  Edema: Trace  Mental Status: Normal mood and affect. Normal behavior. Normal judgment and thought content.   Assessment and Plan:  Pregnancy: [id number] at [redacted]w[redacted]d 1. Supervision of other normal pregnancy, antepartum - Routine OB. Doing well. - Reports increase in nausea over the past several days. Reviewed nausea not uncommon during third trimester. Encouraged small, frequent meals. Declines meds. - Cultures next visit - Anticipatory guidance for upcoming appointments provided  2. [redacted] weeks gestation of pregnancy - Endorses active fetal movement  3. Obesity affecting pregnancy in third trimester,  unspecified obesity type - FH 36cm. Patient has growth [place] next week - Last EFW was 35%  4. History of pre-eclampsia - Continue bASA - BP normal today  Preterm labor symptoms and general obstetric precautions including but not limited to vaginal bleeding, contractions, leaking of fluid and fetal movement were reviewed in detail with the patient. Please refer to After Visit Summary for other counseling recommendations.   No follow-ups on file.  Future Appointments  Date Time Provider Department Center  10/23/2022  3:30 PM [facility] NURSE [facility]  10/23/2022  3:45 PM [facility] US5 [facility]  11/05/2022  8:10 AM Donette Larry, CNM [facility]  11/12/2022  8:50 AM Brand Males, CNM [facility]  11/19/2022  8:30 AM Rasch, Harolyn Rutherford, NP [facility]  11/25/2022  8:50 AM Penne Lash, Fredrich Romans, MD [facility]    Brand Males, CNM

## 2022-10-23 ENCOUNTER — Ambulatory Visit: Payer: Medicaid Other | Attending: Obstetrics and Gynecology

## 2022-10-23 ENCOUNTER — Other Ambulatory Visit: Payer: Self-pay | Admitting: Obstetrics and Gynecology

## 2022-10-23 ENCOUNTER — Ambulatory Visit: Payer: Medicaid Other | Admitting: *Deleted

## 2022-10-23 VITALS — BP 122/61 | HR 81

## 2022-10-23 DIAGNOSIS — O09299 Supervision of pregnancy with other poor reproductive or obstetric history, unspecified trimester: Secondary | ICD-10-CM | POA: Diagnosis present

## 2022-10-23 DIAGNOSIS — O09293 Supervision of pregnancy with other poor reproductive or obstetric history, third trimester: Secondary | ICD-10-CM | POA: Diagnosis not present

## 2022-10-23 DIAGNOSIS — O99213 Obesity complicating pregnancy, third trimester: Secondary | ICD-10-CM

## 2022-10-23 DIAGNOSIS — Z3A34 34 weeks gestation of pregnancy: Secondary | ICD-10-CM

## 2022-10-23 DIAGNOSIS — E669 Obesity, unspecified: Secondary | ICD-10-CM

## 2022-11-05 ENCOUNTER — Other Ambulatory Visit (HOSPITAL_COMMUNITY)
Admission: RE | Admit: 2022-11-05 | Discharge: 2022-11-05 | Disposition: A | Discharge status: Home / Self Care | Payer: Medicaid Other | Source: Ambulatory Visit | Attending: Certified Nurse Midwife | Admitting: Certified Nurse Midwife

## 2022-11-05 ENCOUNTER — Encounter: Payer: Self-pay | Admitting: Certified Nurse Midwife

## 2022-11-05 ENCOUNTER — Ambulatory Visit (INDEPENDENT_AMBULATORY_CARE_PROVIDER_SITE_OTHER): Payer: Medicaid Other | Admitting: Certified Nurse Midwife

## 2022-11-05 VITALS — BP 126/80 | HR 94 | Wt 223.0 lb

## 2022-11-05 DIAGNOSIS — Z3480 Encounter for supervision of other normal pregnancy, unspecified trimester: Secondary | ICD-10-CM

## 2022-11-05 DIAGNOSIS — Z8759 Personal history of other complications of pregnancy, childbirth and the puerperium: Secondary | ICD-10-CM

## 2022-11-05 DIAGNOSIS — Z3A36 36 weeks gestation of pregnancy: Secondary | ICD-10-CM

## 2022-11-05 DIAGNOSIS — Z3483 Encounter for supervision of other normal pregnancy, third trimester: Secondary | ICD-10-CM | POA: Diagnosis not present

## 2022-11-05 NOTE — Progress Notes (Signed)
Subjective:  Lindsey Bennett is a 22 y.o. [phone] at [redacted]w[redacted]d being seen today for ongoing prenatal care.  She is currently monitored for the following issues for this low-risk pregnancy and has BMI 32.0-32.9,adult; History of pre-eclampsia; Supervision of other normal pregnancy, antepartum; Prediabetes; and Obesity affecting pregnancy on their problem list.  Patient reports no complaints.  Contractions: Not present. Vag. Bleeding: None.  Movement: Present. Denies leaking of fluid.   The following portions of the patient's history were reviewed and updated as appropriate: allergies, current medications, past family history, past medical history, past social history, past surgical history and problem list. Problem list updated.  Objective:   Vitals:   11/05/22 0820  BP: 126/80  Pulse: 94  Weight: 223 lb (101.2 kg)    Fetal Status: Fetal Heart Rate (bpm): 156 Fundal Height: 38 cm Movement: Present  Presentation: Vertex  General:  Alert, oriented and cooperative. Patient is in no acute distress.  Skin: Skin is warm and dry. No rash noted.   Cardiovascular: Normal heart rate noted  Respiratory: Normal respiratory effort, no problems with respiration noted  Abdomen: Soft, gravid, appropriate for gestational age. Pain/Pressure: Absent     Pelvic: Vag. Bleeding: None Vag D/C Character: Thin   Cervical exam deferred        Extremities: Normal range of motion.  Edema: None  Mental Status: Normal mood and affect. Normal behavior. Normal judgment and thought content.   Urinalysis:      Assessment and Plan:  Pregnancy: [id number] at [redacted]w[redacted]d  1. Supervision of other normal pregnancy, antepartum - Culture, beta strep (group b only) - Cervicovaginal ancillary only( [place])  2. [redacted] weeks gestation of pregnancy  3. History of pre-eclampsia - continue bASA - watch BP  Preterm labor symptoms and general obstetric precautions including but not limited to vaginal bleeding, contractions, leaking of  fluid and fetal movement were reviewed in detail with the patient. Please refer to After Visit Summary for other counseling recommendations.  Return in about 1 week (around 11/12/2022).   Donette Larry, CNM

## 2022-11-06 LAB — CERVICOVAGINAL ANCILLARY ONLY
Chlamydia: NEGATIVE
Comment: NEGATIVE
Comment: NORMAL
Neisseria Gonorrhea: NEGATIVE

## 2022-11-09 LAB — CULTURE, BETA STREP (GROUP B ONLY): Strep Gp B Culture: NEGATIVE

## 2022-11-12 ENCOUNTER — Ambulatory Visit (INDEPENDENT_AMBULATORY_CARE_PROVIDER_SITE_OTHER): Payer: Medicaid Other

## 2022-11-12 VITALS — BP 126/84 | HR 75 | Wt 228.0 lb

## 2022-11-12 DIAGNOSIS — Z3480 Encounter for supervision of other normal pregnancy, unspecified trimester: Secondary | ICD-10-CM

## 2022-11-12 DIAGNOSIS — Z3A37 37 weeks gestation of pregnancy: Secondary | ICD-10-CM

## 2022-11-12 DIAGNOSIS — Z8759 Personal history of other complications of pregnancy, childbirth and the puerperium: Secondary | ICD-10-CM

## 2022-11-12 NOTE — Progress Notes (Signed)
   PRENATAL VISIT NOTE  Subjective:  Lindsey Bennett is a 22 y.o. [phone] at [redacted]w[redacted]d being seen today for ongoing prenatal care.  She is currently monitored for the following issues for this low-risk pregnancy and has BMI 32.0-32.9,adult; History of pre-eclampsia; Supervision of other normal pregnancy, antepartum; Prediabetes; and Obesity affecting pregnancy on their problem list.  Patient reports no complaints.  Contractions: Irritability. Vag. Bleeding: None.  Movement: Present. Denies leaking of fluid.   The following portions of the patient's history were reviewed and updated as appropriate: allergies, current medications, past family history, past medical history, past social history, past surgical history and problem list.   Objective:   Vitals:   11/12/22 0907  BP: 126/84  Pulse: 75  Weight: 228 lb (103.4 kg)    Fetal Status: Fetal Heart Rate (bpm): 156   Movement: Present     General:  Alert, oriented and cooperative. Patient is in no acute distress.  Skin: Skin is warm and dry. No rash noted.   Cardiovascular: Normal heart rate noted  Respiratory: Normal respiratory effort, no problems with respiration noted  Abdomen: Soft, gravid, appropriate for gestational age.  Pain/Pressure: Present     Pelvic: Cervical exam deferred        Extremities: Normal range of motion.  Edema: None  Mental Status: Normal mood and affect. Normal behavior. Normal judgment and thought content.   Assessment and Plan:  Pregnancy: [id number] at [redacted]w[redacted]d 1. Supervision of other normal pregnancy, antepartum - Routine OB. Doing well, no concerns - Anticipatory guidance for upcoming appointments provided - Discussed contraception methods: patient declines  2. [redacted] weeks gestation of pregnancy - Endorses active fetal movement  3. History of pre-eclampsia - Continue bASA - BP normal   Term labor symptoms and general obstetric precautions including but not limited to vaginal bleeding, contractions, leaking  of fluid and fetal movement were reviewed in detail with the patient. Please refer to After Visit Summary for other counseling recommendations.   Return in about 1 week (around 11/19/2022) for LOB.  Future Appointments  Date Time Provider Department Center  11/19/2022  8:30 AM Rasch, Harolyn Rutherford, NP [facility]  11/25/2022  8:50 AM Penne Lash, Fredrich Romans, MD [facility]    Brand Males, CNM

## 2022-11-19 ENCOUNTER — Encounter: Payer: Self-pay | Admitting: *Deleted

## 2022-11-19 ENCOUNTER — Inpatient Hospital Stay (HOSPITAL_COMMUNITY)
Admission: AD | Admit: 2022-11-19 | Discharge: 2022-11-21 | DRG: 807 | Disposition: A | Discharge status: Home / Self Care | Payer: Medicaid Other | Attending: Obstetrics and Gynecology | Admitting: Obstetrics and Gynecology

## 2022-11-19 ENCOUNTER — Encounter (HOSPITAL_COMMUNITY): Payer: Self-pay | Admitting: Obstetrics and Gynecology

## 2022-11-19 ENCOUNTER — Other Ambulatory Visit: Payer: Self-pay | Admitting: Obstetrics and Gynecology

## 2022-11-19 ENCOUNTER — Ambulatory Visit: Payer: Medicaid Other | Admitting: *Deleted

## 2022-11-19 ENCOUNTER — Ambulatory Visit (HOSPITAL_BASED_OUTPATIENT_CLINIC_OR_DEPARTMENT_OTHER): Payer: Medicaid Other

## 2022-11-19 ENCOUNTER — Ambulatory Visit (INDEPENDENT_AMBULATORY_CARE_PROVIDER_SITE_OTHER): Payer: Medicaid Other | Admitting: Obstetrics and Gynecology

## 2022-11-19 VITALS — BP 126/83 | HR 79 | Wt 229.0 lb

## 2022-11-19 VITALS — BP 134/75 | HR 85

## 2022-11-19 DIAGNOSIS — Z3480 Encounter for supervision of other normal pregnancy, unspecified trimester: Secondary | ICD-10-CM

## 2022-11-19 DIAGNOSIS — O99214 Obesity complicating childbirth: Secondary | ICD-10-CM | POA: Diagnosis present

## 2022-11-19 DIAGNOSIS — Z3A38 38 weeks gestation of pregnancy: Secondary | ICD-10-CM

## 2022-11-19 DIAGNOSIS — Z7982 Long term (current) use of aspirin: Secondary | ICD-10-CM

## 2022-11-19 DIAGNOSIS — R7303 Prediabetes: Secondary | ICD-10-CM

## 2022-11-19 DIAGNOSIS — O134 Gestational [pregnancy-induced] hypertension without significant proteinuria, complicating childbirth: Secondary | ICD-10-CM | POA: Diagnosis present

## 2022-11-19 DIAGNOSIS — O99213 Obesity complicating pregnancy, third trimester: Secondary | ICD-10-CM | POA: Insufficient documentation

## 2022-11-19 DIAGNOSIS — O99210 Obesity complicating pregnancy, unspecified trimester: Secondary | ICD-10-CM | POA: Diagnosis present

## 2022-11-19 DIAGNOSIS — O133 Gestational [pregnancy-induced] hypertension without significant proteinuria, third trimester: Secondary | ICD-10-CM

## 2022-11-19 DIAGNOSIS — O368130 Decreased fetal movements, third trimester, not applicable or unspecified: Secondary | ICD-10-CM | POA: Diagnosis present

## 2022-11-19 LAB — CBC
HCT: 35.8 % — ABNORMAL LOW (ref 36.0–46.0)
Hemoglobin: 11.1 g/dL — ABNORMAL LOW (ref 12.0–15.0)
MCH: 23.6 pg — ABNORMAL LOW (ref 26.0–34.0)
MCHC: 31 g/dL (ref 30.0–36.0)
MCV: 76 fL — ABNORMAL LOW (ref 80.0–100.0)
Platelets: 314 10*3/uL (ref 150–400)
RBC: 4.71 MIL/uL (ref 3.87–5.11)
RDW: 15.3 % (ref 11.5–15.5)
WBC: 9 10*3/uL (ref 4.0–10.5)
nRBC: 0 % (ref 0.0–0.2)

## 2022-11-19 LAB — COMPREHENSIVE METABOLIC PANEL
ALT: 14 U/L (ref 0–44)
AST: 23 U/L (ref 15–41)
Albumin: 2.8 g/dL — ABNORMAL LOW (ref 3.5–5.0)
Alkaline Phosphatase: 205 U/L — ABNORMAL HIGH (ref 38–126)
Anion gap: 11 (ref 5–15)
BUN: 8 mg/dL (ref 6–20)
CO2: 20 mmol/L — ABNORMAL LOW (ref 22–32)
Calcium: 9.3 mg/dL (ref 8.9–10.3)
Chloride: 105 mmol/L (ref 98–111)
Creatinine, Ser: 0.65 mg/dL (ref 0.44–1.00)
GFR, Estimated: >60 mL/min (ref >60)
Glucose, Bld: 92 mg/dL (ref 70–99)
Potassium: 4.2 mmol/L (ref 3.5–5.1)
Sodium: 136 mmol/L (ref 135–145)
Total Bilirubin: 0.2 mg/dL — ABNORMAL LOW (ref 0.3–1.2)
Total Protein: 6.4 g/dL — ABNORMAL LOW (ref 6.5–8.1)

## 2022-11-19 LAB — PROTEIN / CREATININE RATIO, URINE
Creatinine, Urine: 122 mg/dL
Protein Creatinine Ratio: 0.28 mg/mg{Cre} — ABNORMAL HIGH (ref 0.00–0.15)
Total Protein, Urine: 34 mg/dL

## 2022-11-19 MED ORDER — ACETAMINOPHEN 325 MG PO TABS: 650.0000 mg | ORAL_TABLET | ORAL | Status: DC | PRN

## 2022-11-19 MED ORDER — SOD CITRATE-CITRIC ACID 500-334 MG/5ML PO SOLN
30.0000 mL | ORAL | Status: DC | PRN
Start: 2022-11-19 — End: 2022-11-19

## 2022-11-19 MED ORDER — LIDOCAINE HCL (PF) 1 % IJ SOLN: 30.0000 mL | INTRAMUSCULAR | Status: DC | PRN

## 2022-11-19 MED ORDER — ONDANSETRON HCL 4 MG/2ML IJ SOLN: 4.0000 mg | Freq: Four times a day (QID) | INTRAMUSCULAR | Status: DC | PRN

## 2022-11-19 MED ORDER — OXYTOCIN-SODIUM CHLORIDE 30-0.9 UT/500ML-% IV SOLN
2.5000 [IU]/h | INTRAVENOUS | Status: DC
Start: 2022-11-20 — End: 2022-11-19

## 2022-11-19 MED ORDER — OXYCODONE-ACETAMINOPHEN 5-325 MG PO TABS: 1.0000 | ORAL_TABLET | ORAL | Status: DC | PRN

## 2022-11-19 MED ORDER — OXYTOCIN BOLUS FROM INFUSION
333.0000 mL | Freq: Once | INTRAVENOUS | Status: AC
  Administered 2022-11-20: 333 mL via INTRAVENOUS

## 2022-11-19 MED ORDER — LACTATED RINGERS IV SOLN: 500.0000 mL | INTRAVENOUS | Status: DC | PRN

## 2022-11-19 MED ORDER — OXYTOCIN BOLUS FROM INFUSION: 333.0000 mL | Freq: Once | INTRAVENOUS | Status: DC

## 2022-11-19 MED ORDER — LACTATED RINGERS IV SOLN
INTRAVENOUS | Status: DC
  Administered 2022-11-20: 125 mL/h via INTRAVENOUS

## 2022-11-19 MED ORDER — LABETALOL HCL 5 MG/ML IV SOLN: 80.0000 mg | INTRAVENOUS | Status: DC | PRN

## 2022-11-19 MED ORDER — OXYCODONE-ACETAMINOPHEN 5-325 MG PO TABS: 2.0000 | ORAL_TABLET | ORAL | Status: DC | PRN

## 2022-11-19 MED ORDER — OXYTOCIN-SODIUM CHLORIDE 30-0.9 UT/500ML-% IV SOLN
2.5000 [IU]/h | INTRAVENOUS | Status: DC
  Filled 2022-11-19: qty 500

## 2022-11-19 MED ORDER — LABETALOL HCL 5 MG/ML IV SOLN: 20.0000 mg | INTRAVENOUS | Status: DC | PRN

## 2022-11-19 MED ORDER — LIDOCAINE HCL (PF) 1 % IJ SOLN
30.0000 mL | INTRAMUSCULAR | Status: DC | PRN
Start: 2022-11-19 — End: 2022-11-19

## 2022-11-19 MED ORDER — LABETALOL HCL 5 MG/ML IV SOLN: 40.0000 mg | INTRAVENOUS | Status: DC | PRN

## 2022-11-19 MED ORDER — FLEET ENEMA 7-19 GM/118ML RE ENEM: 1.0000 | ENEMA | RECTAL | Status: DC | PRN

## 2022-11-19 MED ORDER — LACTATED RINGERS IV SOLN: INTRAVENOUS | Status: DC

## 2022-11-19 MED ORDER — HYDRALAZINE HCL 20 MG/ML IJ SOLN: 10.0000 mg | INTRAMUSCULAR | Status: DC | PRN

## 2022-11-19 MED ORDER — SOD CITRATE-CITRIC ACID 500-334 MG/5ML PO SOLN: 30.0000 mL | ORAL | Status: DC | PRN

## 2022-11-19 NOTE — MAU Provider Note (Signed)
History     [id number]  Arrival date and time: 11/19/22 2148    Chief Complaint  Patient presents with   Decreased Fetal Movement     HPI Lindsey Bennett is a 22 y.o. [id number] at [redacted]w[redacted]d by LMP who presents for decreased fetal movement. States she has not felt baby move since this morning.  History of preeclampsia in previous pregnancy. Denies hypertension outside of pregnancy. Denies headache, visual disturbance, or epigastric pain. Denies contractions, LOF, or vaginal bleeding.    A/RH(D) POSITIVE/-- (10/31 1611)  OB History     Gravida  5   Para  2   Term  2   Preterm  0   AB  2   Living  2      SAB  2   IAB  0   Ectopic  0   Multiple  0   Live Births  2           Past Medical History:  Diagnosis Date   History of pre-eclampsia 08/04/2020   Prediabetes 05/08/2022    Past Surgical History:  Procedure Laterality Date   WISDOM TOOTH EXTRACTION Bilateral 2017    Family History  Problem Relation Age of Onset   Obesity Neg Hx    Hypertension Neg Hx    Heart disease Neg Hx    Diabetes Neg Hx    Asthma Neg Hx    Cancer Neg Hx     Social History   Socioeconomic History   Marital status: Married    Spouse name: Not on file   Number of children: Not on file   Years of education: Not on file   Highest education level: Not on file  Occupational History   Not on file  Tobacco Use   Smoking status: Never   Smokeless tobacco: Never  Vaping Use   Vaping Use: Never used  Substance and Sexual Activity   Alcohol use: No   Drug use: No   Sexual activity: Yes    Birth control/protection: None  Other Topics Concern   Not on file  Social History Narrative   Not on file   Social Determinants of Health   Financial Resource Strain: Not on file  Food Insecurity: Not on file  Transportation Needs: Not on file  Physical Activity: Not on file  Stress: Not on file  Social Connections: Not on file  Intimate Partner Violence: Not on file     No Known Allergies  No current facility-administered medications on file prior to encounter.   Current Outpatient Medications on File Prior to Encounter  Medication Sig Dispense Refill   aspirin EC 81 MG tablet Take 81 mg by mouth daily. Swallow whole.     Prenatal Vit-Fe Fumarate-FA (MULTIVITAMIN-PRENATAL) 27-0.8 MG TABS tablet Take 1 tablet by mouth daily at 12 noon.       ROS Pertinent positives and negative per HPI, all others reviewed and negative  Physical Exam   BP (!) 158/87   Pulse 90   Temp 98.1 F (36.7 C)   Resp 17   Ht 5\' 1"  (1.549 m)   Wt 105.2 kg   LMP 02/21/2022   SpO2 99%   BMI 43.84 kg/m   Patient Vitals for the past 24 hrs:  BP Temp Pulse Resp SpO2 Height Weight  11/19/22 2302 (!) 158/87 -- 90 -- -- -- --  11/19/22 2246 (!) 160/110 -- (!) 101 -- 99 % -- --  11/19/22 2234 (!) 144/85 -- 86 -- -- -- --  11/19/22 2222 132/65 -- 87 -- 98 % -- --  11/19/22 2157 (!) 145/74 98.1 F (36.7 C) 86 17 99 % 5\' 1"  (1.549 m) 105.2 kg    Physical Exam Vitals and nursing note reviewed. Exam conducted with a chaperone present.  Constitutional:      General: She is not in acute distress.    Appearance: Normal appearance. She is not ill-appearing.  HENT:     Head: Normocephalic and atraumatic.  Eyes:     General: No scleral icterus.    Pupils: Pupils are equal, round, and reactive to light.  Pulmonary:     Effort: Pulmonary effort is normal. No respiratory distress.  Abdominal:     Tenderness: There is no abdominal tenderness.     Comments: gravid  Musculoskeletal:     Right lower leg: 1+ Pitting Edema present.     Left lower leg: 1+ Pitting Edema present.  Skin:    General: Skin is warm and dry.  Neurological:     Mental Status: She is alert.       FHT Baseline 150, moderate variability, 15x15 accels, no decels Toco: irregular Cat: 1  Labs No results found for this or any previous visit (from the past 24 hour(s)).  Imaging [place] MFM OB FOLLOW  UP  Result Date: 11/19/2022 ----------------------------------------------------------------------  OBSTETRICS REPORT                       (Signed Final 11/19/2022 03:06 pm) ---------------------------------------------------------------------- Patient Info  ID #:       [id number]                          D.O.B.:  11-14-2000 (22 yrs)  Name:       Lindsey Bennett                Visit Date: 11/19/2022 12:59 pm ---------------------------------------------------------------------- Performed By  Attending:        Ma Rings MD         Ref. Address:     [address] [place]  Performed By:     Earley Brooke     Location[facility]                    BS, RDMS                                 [facility] at                                                             [facility]  Referred By:      Everardo All ---------------------------------------------------------------------- Orders  #  Description                           Code        Ordered By  1  [place] MFM OB FOLLOW UP                   [phone]    JENNIFER RASCH  2  [place] MFM FETAL BPP                      [id number]     JENNIFER [facility]     W/NONSTRESS ----------------------------------------------------------------------  #  Order #                     Accession #                Episode #  1  [id number]                   [id number]                 [id number]  2  [id number]                   [id number]                 [id number] ---------------------------------------------------------------------- Indications  Obesity complicating pregnancy, third          O99.213  trimester  Poor obstetric history: Previous               O09.299  preeclampsia / eclampsia/gestational HTN  [redacted] weeks gestation of pregnancy                Z3A.38 ---------------------------------------------------------------------- Fetal Evaluation   Num Of Fetuses:         1  Fetal Heart Rate(bpm):  159  Cardiac Activity:       Observed  Presentation:           Cephalic  Placenta:               Anterior Fundal  P. Cord Insertion:      Previously visualized  Amniotic Fluid  AFI FV:      Within normal limits  AFI Sum(cm)     %Tile       Largest Pocket(cm)  7.98            11          4.28  RUQ(cm)       RLQ(cm)       LUQ(cm)        LLQ(cm)  4.28          1.09          0              2.61 ---------------------------------------------------------------------- Biophysical Evaluation  Amniotic F.V:   Within normal limits       F. Tone:        Not Observed  F. Movement:    Not Observed               N.S.T:          Reactive  F. Breathing:   Observed                   Score:  6/10 ---------------------------------------------------------------------- Biometry  BPD:      90.6  mm     G. Age:  36w 5d         26  %    CI:        77.41   %    70 - 86                                                          FL/HC:      22.7   %    20.6 - 23.4  HC:       326   mm     G. Age:  37w 0d          6  %    HC/AC:      0.92        0.87 - 1.06  AC:      352.7  mm     G. Age:  39w 1d         81  %    FL/BPD:     81.7   %    71 - 87  FL:         74  mm     G. Age:  37w 6d         34  %    FL/AC:      21.0   %    20 - 24  Est. FW:    3457  gm    7 lb 10 oz      57  % ---------------------------------------------------------------------- OB History  Gravidity:    5         Term:   2 ---------------------------------------------------------------------- Gestational Age  LMP:           38w 5d        Date:  02/21/22                  EDD:   11/28/22  U/S Today:     37w 5d                                        EDD:   12/05/22  Best:          38w 5d     Det. By:  LMP  (02/21/22)          EDD:   11/28/22 ---------------------------------------------------------------------- Comments  This patient was seen for a follow up growth scan and BPP  due to maternal obesity with a BMI of 40.  She  denies any  problems since her last exam and reports feeling fetal  movements throughout the day.[name]  She was informed that the fetal growth and amniotic fluid  level appears appropriate for her gestational age.  A BPP performed today was 6 out of 10.  She received a -2  for absent fetal tone and another -2 for fetal movements that  did not meet criteria.  Due to the a BPP of 6 out of 10, she will return in 2 days for  another NST.  Delivery will be recommended at that time  should there be any questions regarding the fetal status.  Fetal kick count  instructions were reviewed today.  She was  advised to go to the hospital should she feel decreased fetal  movements. ----------------------------------------------------------------------                   Ma Rings, MD Electronically Signed Final Report   11/19/2022 03:06 pm ----------------------------------------------------------------------  [place] MFM FETAL BPP W/NONSTRESS  Result Date: 11/19/2022 ----------------------------------------------------------------------  OBSTETRICS REPORT                       (Signed Final 11/19/2022 03:06 pm) ---------------------------------------------------------------------- Patient Info  ID #:       [id number]                          D.O.B.:  2001/03/13 (22 yrs)  Name:       Lindsey Bennett                Visit Date: 11/19/2022 12:59 pm ---------------------------------------------------------------------- Performed By  Attending:        Ma Rings MD         Ref. Address:     [address] [place]  Performed By:     Earley Brooke     Location:         [facility]                    BS, RDMS                                 [facility]  Referred By:      Everardo All  ---------------------------------------------------------------------- Orders  #  Description                           Code        Ordered By  1  [place] MFM OB FOLLOW UP                   [phone]    JENNIFER RASCH  2  [place] MFM FETAL BPP                      [id number]     JENNIFER [facility]     W/NONSTRESS ----------------------------------------------------------------------  #  Order #  Accession #                Episode #  1  [id number]                   [id number]                 [id number]  2  [id number]                   [id number]                 [id number] ---------------------------------------------------------------------- Indications  Obesity complicating pregnancy, third          O99.213  trimester  Poor obstetric history: Previous               O09.299  preeclampsia / eclampsia/gestational HTN  [redacted] weeks gestation of pregnancy                Z3A.38 ---------------------------------------------------------------------- Fetal Evaluation  Num Of Fetuses:         1  Fetal Heart Rate(bpm):  159  Cardiac Activity:       Observed  Presentation:           Cephalic  Placenta:               Anterior Fundal  P. Cord Insertion:      Previously visualized  Amniotic Fluid  AFI FV:      Within normal limits  AFI Sum(cm)     %Tile       Largest Pocket(cm)  7.98            11          4.28  RUQ(cm)       RLQ(cm)       LUQ(cm)        LLQ(cm)  4.28          1.09          0              2.61 ---------------------------------------------------------------------- Biophysical Evaluation  Amniotic F.V:   Within normal limits       F. Tone:        Not Observed  F. Movement:    Not Observed               N.S.T:          Reactive  F. Breathing:   Observed                   Score:          6/10 ---------------------------------------------------------------------- Biometry  BPD:      90.6  mm     G. Age:  36w 5d         26  %    CI:        77.41   %    70 - 86                                                          FL/HC:       22.7   %    20.6 - 23.4  HC:       326   mm     G. Age:  37w 40d  6  %    HC/AC:      0.92        0.87 - 1.06  AC:      352.7  mm     G. Age:  39w 1d         81  %    FL/BPD:     81.7   %    71 - 87  FL:         74  mm     G. Age:  37w 6d         34  %    FL/AC:      21.0   %    20 - 24  Est. FW:    3457  gm    7 lb 10 oz      57  % ---------------------------------------------------------------------- OB History  Gravidity:    5         Term:   2 ---------------------------------------------------------------------- Gestational Age  LMP:           38w 5d        Date:  02/21/22                  EDD:   11/28/22  U/S Today:     37w 5d                                        EDD:   12/05/22  Best:          38w 5d     Det. By:  LMP  (02/21/22)          EDD:   11/28/22 ---------------------------------------------------------------------- Comments  This patient was seen for a follow up growth scan and BPP  due to maternal obesity with a BMI of 40.  She denies any  problems since her last exam and reports feeling fetal  movements throughout the day.[name]  She was informed that the fetal growth and amniotic fluid  level appears appropriate for her gestational age.  A BPP performed today was 6 out of 10.  She received a -2  for absent fetal tone and another -2 for fetal movements that  did not meet criteria.  Due to the a BPP of 6 out of 10, she will return in 2 days for  another NST.  Delivery will be recommended at that time  should there be any questions regarding the fetal status.  Fetal kick count instructions were reviewed today.  She was  advised to go to the hospital should she feel decreased fetal  movements. ----------------------------------------------------------------------                   Ma Rings, MD Electronically Signed Final Report   11/19/2022 03:06 pm ----------------------------------------------------------------------   MAU Course  Procedures Lab Orders         CBC         RPR          Comprehensive metabolic panel         Protein / creatinine ratio, urine     No orders of the defined types were placed in this encounter.  Imaging Orders  No imaging studies ordered today    MDM High  Assessment and Plan   1. Gestational hypertension, third trimester   2. Decreased fetal movements in third trimester, single or unspecified fetus  3. [redacted] weeks gestation of pregnancy    -Reactive tracing but patient still reports decreased movement. Reviewed imaging from earlier today, had 6/10 BPP (off for tone & movement). New onset elevated BPs this evening. She is asymptomatic but has hx of PEC in previous pregnancy. Preeclampsia labs pending. Reviewed with Dr. Jolayne Panther, will keep for IOL.    Judeth Horn, NP 11/19/22 11:14 PM

## 2022-11-19 NOTE — H&P (Signed)
OBSTETRIC ADMISSION HISTORY AND PHYSICAL  Lindsey Bennett is a 22 y.o. female [phone] with IUP at [redacted]w[redacted]d by LMP presenting for DFM, BPP 6/10 in the office today and elevated blood pressures. She reports +FMs, No LOF, no VB, no blurry vision, headaches or peripheral edema, and RUQ pain.  She plans on breast feeding. She request nothing for birth control. She received her prenatal care at  [facility]    Dating: By LMP --->  Estimated Date of Delivery: 11/28/22  Sono:    @[redacted]w[redacted]d , CWD, normal anatomy, cephalic presentation, anterior fundal placental lie, 2625g, 57% EFW  Prenatal History/Complications:  -Hx of preE -Prediabetes -Obesity  Past Medical History: Past Medical History:  Diagnosis Date   Medical history non-contributory     Past Surgical History: Past Surgical History:  Procedure Laterality Date   NO PAST SURGERIES     WISDOM TOOTH EXTRACTION Bilateral 2017    Obstetrical History: OB History     Gravida  5   Para  2   Term  2   Preterm  0   AB  2   Living  2      SAB  2   IAB  0   Ectopic  0   Multiple  0   Live Births  2           Social History Social History   Socioeconomic History   Marital status: Married    Spouse name: Not on file   Number of children: Not on file   Years of education: Not on file   Highest education level: Not on file  Occupational History   Not on file  Tobacco Use   Smoking status: Never   Smokeless tobacco: Never  Vaping Use   Vaping Use: Never used  Substance and Sexual Activity   Alcohol use: No   Drug use: No   Sexual activity: Yes    Birth control/protection: None  Other Topics Concern   Not on file  Social History Narrative   Not on file   Social Determinants of Health   Financial Resource Strain: Not on file  Food Insecurity: Not on file  Transportation Needs: Not on file  Physical Activity: Not on file  Stress: Not on file  Social Connections: Not on file    Family History: Family History   Problem Relation Age of Onset   Obesity Neg Hx    Hypertension Neg Hx    Heart disease Neg Hx    Diabetes Neg Hx    Asthma Neg Hx    Cancer Neg Hx     Allergies: No Known Allergies  Medications Prior to Admission  Medication Sig Dispense Refill Last Dose   aspirin EC 81 MG tablet Take 81 mg by mouth daily. Swallow whole.   11/18/2022   Prenatal Vit-Fe Fumarate-FA (MULTIVITAMIN-PRENATAL) 27-0.8 MG TABS tablet Take 1 tablet by mouth daily at 12 noon.   11/18/2022     Review of Systems   All systems reviewed and negative except as stated in HPI  Blood pressure (!) 158/87, pulse 90, temperature 98.1 F (36.7 C), resp. rate 17, height 5\' 1"  (1.549 m), weight 105.2 kg, last menstrual period 02/21/2022, SpO2 99 %, currently breastfeeding. General appearance: {general exam:16600} Lungs: normal effort Heart: regular rate noted Abdomen: gravid Extremities: No LE edema Presentation: {desc; fetal presentation:14558} Fetal monitoring{findings; monitor fetal heart monitor:31527} Uterine activity{Uterine contractions:31516}     Prenatal labs: ABO, Rh: A/RH(D) POSITIVE/-- (10/31 1611) Antibody: NO ANTIBODIES DETECTED (  10/31 1611) Rubella: 1.61 (10/31 1611) RPR: Non Reactive (02/29 0941)  HBsAg: NON-REACTIVE (10/31 1611)  HIV: Non Reactive (02/29 0941)  GBS: Negative/-- (04/30 0000)  1 hr Glucola 152 Genetic screening  Declined Anatomy US wnl, female  Prenatal Transfer Tool  Maternal Diabetes: No Genetic Screening: Declined Maternal Ultrasounds/Referrals: Normal Fetal Ultrasounds or other Referrals:  None Maternal Substance Abuse:  No Significant Maternal Medications:  None Significant Maternal Lab Results:  Group B Strep negative Number of Prenatal Visits:greater than 3 verified prenatal visits Other Comments:  None  No results found for this or any previous visit (from the past 24 hour(s)).  Patient Active Problem List   Diagnosis Date Noted   Obesity affecting pregnancy  09/24/2022   Prediabetes 05/08/2022   Supervision of other normal pregnancy, antepartum 05/03/2022   History of pre-eclampsia 08/04/2020   BMI 32.0-32.9,adult 04/05/2019    Assessment/Plan:  Lindsey Bennett is a 22 y.o. [id number] at [redacted]w[redacted]d here for DFM, BPP 6/10, and elevated BP readings.   #Labor:*** #Pain: *** #FWB: *** #ID: GBS neg #MOF: Breast #MOC: Declined #Circ: No  Hx of preE w/ elevated BP  1 severe range.  -Labs pending -BP protocol  Akon Reinoso Autry-Lott, DO  11/19/2022, 11:06 PM

## 2022-11-19 NOTE — MAU Note (Signed)
.  Lindsey Bennett is a 22 y.o. at [redacted]w[redacted]d here in MAU reporting decreased FM all day. Feeling more pelvic pressure than FM today. Had u/s at [facility] and was normal but told to come in if she continued to not feel normal FM. Denies LOF or VB. No pain currently  Onset of complaint: today Pain score: 0 Vitals:   11/19/22 2157  BP: (!) 145/74  Pulse: 86  Resp: 17  Temp: 98.1 F (36.7 C)  SpO2: 99%     FHT:160 Lab orders placed from triage:  none

## 2022-11-19 NOTE — Procedures (Signed)
Lindsey Bennett 2000-09-08 [redacted]w[redacted]d  Fetus A Non-Stress Test Interpretation for 11/19/22  Indication: Unsatisfactory BPP  Fetal Heart Rate A Mode: External Baseline Rate (A): 150 bpm Variability: Moderate Accelerations: 15 x 15 Multiple birth?: No  Uterine Activity Mode: Toco Contraction Frequency (min): occas Contraction Duration (sec): 60 Contraction Quality: Mild Resting Tone Palpated: Relaxed Resting Time: Adequate  Interpretation (Fetal Testing) Nonstress Test Interpretation: Reactive Overall Impression: Reassuring for gestational age Comments: tracing reviewed by Dr. Parke Poisson

## 2022-11-19 NOTE — Progress Notes (Signed)
   PRENATAL VISIT NOTE  Subjective:  Lindsey Bennett is a 22 y.o. [phone] at [redacted]w[redacted]d being seen today for ongoing prenatal care.  She is currently monitored for the following issues for this low-risk pregnancy and has BMI 32.0-32.9,adult; History of pre-eclampsia; Supervision of other normal pregnancy, antepartum; Prediabetes; and Obesity affecting pregnancy on their problem list.  Patient reports no complaints.  Contractions: Irritability. Vag. Bleeding: None.  Movement: Present. Denies leaking of fluid.   The following portions of the patient's history were reviewed and updated as appropriate: allergies, current medications, past family history, past medical history, past social history, past surgical history and problem list.   Objective:   Vitals:   11/19/22 0824  BP: 126/83  Pulse: 79  Weight: 229 lb (103.9 kg)    Fetal Status: Fetal Heart Rate (bpm): 146 Fundal Height: 41 cm Movement: Present     General:  Alert, oriented and cooperative. Patient is in no acute distress.  Skin: Skin is warm and dry. No rash noted.   Cardiovascular: Normal heart rate noted  Respiratory: Normal respiratory effort, no problems with respiration noted  Abdomen: Soft, gravid, appropriate for gestational age.  Pain/Pressure: Present     Pelvic: Cervical exam deferred        Extremities: Normal range of motion.  Edema: None  Mental Status: Normal mood and affect. Normal behavior. Normal judgment and thought content.   Assessment and Plan:  Pregnancy: [id number] at [redacted]w[redacted]d 1. Supervision of other normal pregnancy, antepartum  Doing well Good movement.  Size> dates. Will get Growth [place] with MFM Discussed induction, she would like to try and go into labor on her own.    Preterm labor symptoms and general obstetric precautions including but not limited to vaginal bleeding, contractions, leaking of fluid and fetal movement were reviewed in detail with the patient. Please refer to After Visit Summary for  other counseling recommendations.   No follow-ups on file.  Future Appointments  Date Time Provider Department Center  11/25/2022  8:50 AM Penne Lash, Fredrich Romans, MD [facility]    Venia Carbon, NP

## 2022-11-20 ENCOUNTER — Other Ambulatory Visit: Payer: Self-pay

## 2022-11-20 ENCOUNTER — Encounter (HOSPITAL_COMMUNITY): Payer: Self-pay | Admitting: Obstetrics and Gynecology

## 2022-11-20 DIAGNOSIS — O368130 Decreased fetal movements, third trimester, not applicable or unspecified: Secondary | ICD-10-CM

## 2022-11-20 DIAGNOSIS — O134 Gestational [pregnancy-induced] hypertension without significant proteinuria, complicating childbirth: Secondary | ICD-10-CM

## 2022-11-20 DIAGNOSIS — Z3A38 38 weeks gestation of pregnancy: Secondary | ICD-10-CM

## 2022-11-20 LAB — TYPE AND SCREEN
ABO/RH(D): A POS
Antibody Screen: NEGATIVE

## 2022-11-20 LAB — RPR: RPR Ser Ql: NONREACTIVE

## 2022-11-20 MED ORDER — DIBUCAINE (PERIANAL) 1 % EX OINT: 1.0000 | TOPICAL_OINTMENT | CUTANEOUS | Status: DC | PRN

## 2022-11-20 MED ORDER — PHENYLEPHRINE 80 MCG/ML (10ML) SYRINGE FOR IV PUSH (FOR BLOOD PRESSURE SUPPORT): 80.0000 ug | PREFILLED_SYRINGE | INTRAVENOUS | Status: DC | PRN

## 2022-11-20 MED ORDER — TETANUS-DIPHTH-ACELL PERTUSSIS 5-2.5-18.5 LF-MCG/0.5 IM SUSY: 0.5000 mL | PREFILLED_SYRINGE | Freq: Once | INTRAMUSCULAR | Status: DC

## 2022-11-20 MED ORDER — FENTANYL-BUPIVACAINE-NACL 0.5-0.125-0.9 MG/250ML-% EP SOLN
12.0000 mL/h | EPIDURAL | Status: DC | PRN
  Filled 2022-11-20: qty 250

## 2022-11-20 MED ORDER — BENZOCAINE-MENTHOL 20-0.5 % EX AERO: 1.0000 | INHALATION_SPRAY | CUTANEOUS | Status: DC | PRN

## 2022-11-20 MED ORDER — OXYTOCIN-SODIUM CHLORIDE 30-0.9 UT/500ML-% IV SOLN
1.0000 m[IU]/min | INTRAVENOUS | Status: DC
  Administered 2022-11-20: 2 m[IU]/min via INTRAVENOUS

## 2022-11-20 MED ORDER — FENTANYL CITRATE (PF) 100 MCG/2ML IJ SOLN: 100.0000 ug | INTRAMUSCULAR | Status: DC | PRN

## 2022-11-20 MED ORDER — PRENATAL MULTIVITAMIN CH
1.0000 | ORAL_TABLET | Freq: Every day | ORAL | Status: DC
  Administered 2022-11-21: 1 via ORAL
  Filled 2022-11-20: qty 1

## 2022-11-20 MED ORDER — ONDANSETRON HCL 4 MG/2ML IJ SOLN: 4.0000 mg | INTRAMUSCULAR | Status: DC | PRN

## 2022-11-20 MED ORDER — COCONUT OIL OIL: 1.0000 | TOPICAL_OIL | Status: DC | PRN

## 2022-11-20 MED ORDER — SIMETHICONE 80 MG PO CHEW: 80.0000 mg | CHEWABLE_TABLET | ORAL | Status: DC | PRN

## 2022-11-20 MED ORDER — WITCH HAZEL-GLYCERIN EX PADS: 1.0000 | MEDICATED_PAD | CUTANEOUS | Status: DC | PRN

## 2022-11-20 MED ORDER — IBUPROFEN 600 MG PO TABS
600.0000 mg | ORAL_TABLET | Freq: Four times a day (QID) | ORAL | Status: DC
  Administered 2022-11-20 – 2022-11-21 (×4): 600 mg via ORAL
  Filled 2022-11-20 (×4): qty 1

## 2022-11-20 MED ORDER — TERBUTALINE SULFATE 1 MG/ML IJ SOLN: 0.2500 mg | Freq: Once | INTRAMUSCULAR | Status: DC | PRN

## 2022-11-20 MED ORDER — ACETAMINOPHEN 325 MG PO TABS: 650.0000 mg | ORAL_TABLET | ORAL | Status: DC | PRN

## 2022-11-20 MED ORDER — ZOLPIDEM TARTRATE 5 MG PO TABS: 5.0000 mg | ORAL_TABLET | Freq: Every evening | ORAL | Status: DC | PRN

## 2022-11-20 MED ORDER — LACTATED RINGERS IV SOLN: 500.0000 mL | Freq: Once | INTRAVENOUS | Status: DC

## 2022-11-20 MED ORDER — EPHEDRINE 5 MG/ML INJ: 10.0000 mg | INTRAVENOUS | Status: DC | PRN

## 2022-11-20 MED ORDER — ONDANSETRON HCL 4 MG PO TABS: 4.0000 mg | ORAL_TABLET | ORAL | Status: DC | PRN

## 2022-11-20 MED ORDER — DIPHENHYDRAMINE HCL 50 MG/ML IJ SOLN: 12.5000 mg | INTRAMUSCULAR | Status: DC | PRN

## 2022-11-20 MED ORDER — DIPHENHYDRAMINE HCL 25 MG PO CAPS: 25.0000 mg | ORAL_CAPSULE | Freq: Four times a day (QID) | ORAL | Status: DC | PRN

## 2022-11-20 MED ORDER — SENNOSIDES-DOCUSATE SODIUM 8.6-50 MG PO TABS
2.0000 | ORAL_TABLET | Freq: Every day | ORAL | Status: DC
  Administered 2022-11-21: 2 via ORAL
  Filled 2022-11-20: qty 2

## 2022-11-20 NOTE — Lactation Note (Signed)
This note was copied from a baby's chart. Lactation Consultation Note  Patient Name: Lindsey Bennett [id number] Date: 11/20/2022 Age:24 hours  Attempted to see mom but she was sleeping.   Maternal Data    Feeding    LATCH Score                    Lactation Tools Discussed/Used    Interventions    Discharge    Consult Status      Charyl Dancer 11/20/2022, 10:30 PM

## 2022-11-20 NOTE — Progress Notes (Signed)
Lindsey Bennett is a 22 y.o. [id number] at [redacted]w[redacted]d by LMP admitted for induction of labor due to gestational hypertension.  Subjective: Patient doing well.   Objective: BP 138/62 (BP Location: Right Arm)   Pulse 77   Temp 97.9 F (36.6 C) (Oral)   Resp 17   Ht 5\' 1"  (1.549 m)   Wt 105.2 kg   LMP 02/21/2022   SpO2 99%   BMI 43.84 kg/m  No intake/output data recorded. No intake/output data recorded.  FHT:  FHR: 135 bpm, variability: moderate,  accelerations:  Present,  decelerations:  Absent UC:   irregular, every 1-5 minutes SVE:   Dilation: 4.5 Effacement (%): 80 Station: -1 Exam by:: Suzie Portela, CNM  Labs: Lab Results  Component Value Date   WBC 9.0 11/19/2022   HGB 11.1 (L) 11/19/2022   HCT 35.8 (L) 11/19/2022   MCV 76.0 (L) 11/19/2022   PLT 314 11/19/2022   Patient Vitals for the past 24 hrs:  BP Temp Temp src Pulse Resp SpO2 Height Weight  11/20/22 1049 138/62 -- -- 77 17 -- -- --  11/20/22 0937 132/62 -- -- 88 -- -- -- --  11/20/22 0727 (!) 140/81 97.9 F (36.6 C) Oral 73 18 -- -- --  11/20/22 0430 125/65 -- -- 86 -- -- -- --  11/20/22 0212 (!) 114/47 -- -- 85 -- -- -- --  11/20/22 0100 129/75 -- -- 81 -- -- -- --  11/19/22 2342 (!) 134/59 98.8 F (37.1 C) -- 84 -- -- -- --  11/19/22 2316 (!) 154/92 -- -- 85 -- 99 % -- --  11/19/22 2302 (!) 158/87 -- -- 90 -- -- -- --  11/19/22 2246 (!) 160/110 -- -- (!) 101 -- 99 % -- --  11/19/22 2234 (!) 144/85 -- -- 86 -- -- -- --  11/19/22 2222 132/65 -- -- 87 -- 98 % -- --  11/19/22 2157 (!) 145/74 98.1 F (36.7 C) -- 86 17 99 % 5\' 1"  (1.549 m) 105.2 kg    Assessment / Plan: Induction of labor due to gestational hypertension,  progressing well on pitocin  Labor: Progressing on Pitocin, will continue to increase then AROM Preeclampsia:  labs stable and BPs labile.  Fetal Wellbeing:  Category I Pain Control:  Labor support without medications I/D:   GBS negative  Anticipated MOD:  NSVD  Claudette Head, CNM 11/20/2022,  1:36 PM

## 2022-11-20 NOTE — Progress Notes (Signed)
Lindsey Bennett is a 22 y.o. [id number] at [redacted]w[redacted]d by LMP admitted for induction of labor due to gestational hypertension..  Subjective: Patient doing well. Requesting AROM.   Objective: BP (!) 141/54   Pulse 90   Temp 98.4 F (36.9 C) (Oral)   Resp 17   Ht 5\' 1"  (1.549 m)   Wt 105.2 kg   LMP 02/21/2022   SpO2 99%   BMI 43.84 kg/m  No intake/output data recorded. No intake/output data recorded.  FHT:  FHR: 135 bpm, variability: moderate,  accelerations:  Present,  decelerations:  Absent UC:   regular, every 2-3 minutes SVE:   Dilation: 5.5 Effacement (%): 80 Station: -1 Exam by:: Suzie Portela, CNM  Labs: Lab Results  Component Value Date   WBC 9.0 11/19/2022   HGB 11.1 (L) 11/19/2022   HCT 35.8 (L) 11/19/2022   MCV 76.0 (L) 11/19/2022   PLT 314 11/19/2022   Patient Vitals for the past 24 hrs:  BP Temp Temp src Pulse Resp SpO2 Height Weight  11/20/22 1256 (!) 141/54 98.4 F (36.9 C) Oral 90 -- -- -- --  11/20/22 1049 138/62 -- -- 77 17 -- -- --  11/20/22 0937 132/62 -- -- 88 -- -- -- --  11/20/22 0727 (!) 140/81 97.9 F (36.6 C) Oral 73 18 -- -- --  11/20/22 0430 125/65 -- -- 86 -- -- -- --  11/20/22 0212 (!) 114/47 -- -- 85 -- -- -- --  11/20/22 0100 129/75 -- -- 81 -- -- -- --  11/19/22 2342 (!) 134/59 98.8 F (37.1 C) -- 84 -- -- -- --  11/19/22 2316 (!) 154/92 -- -- 85 -- 99 % -- --  11/19/22 2302 (!) 158/87 -- -- 90 -- -- -- --  11/19/22 2246 (!) 160/110 -- -- (!) 101 -- 99 % -- --  11/19/22 2234 (!) 144/85 -- -- 86 -- -- -- --  11/19/22 2222 132/65 -- -- 87 -- 98 % -- --  11/19/22 2157 (!) 145/74 98.1 F (36.7 C) -- 86 17 99 % 5\' 1"  (1.549 m) 105.2 kg   CNM to patient bedside to discuss AROM. Cat I tracing at this time. Reviewed Risks and benefits of AROM and patient desires to proceed with AROM. Exam prior to AROM was 4.5/70/-1. AROM completed successfully with a amnihook. Clear fluid noted. Fetal head well applied to cervix post AROM. FHT remained Cat I following  AROM. Exam afterwards now 5.5/70/-1   Assessment / Plan: Induction of labor due to gestational hypertension,  progressing well on pitocin, S/p AROM   Labor: Progressing normally. AROM completed clear fluid noted. Continue to increase pit as needed.  Preeclampsia:  labs stable and labile BPs  Fetal Wellbeing:  Category I Pain Control:  Labor support without medications. Patient reports that she is open to epidural. Patient may have an epidural upon request. Orders also placed for IV pain management if patient desires.  I/D:   GBS negative  Anticipated MOD:  NSVD  Claudette Head, CNM 11/20/2022, 3:38 PM

## 2022-11-20 NOTE — Discharge Summary (Signed)
Postpartum Discharge Summary      Patient Name: Lindsey Bennett DOB: 2001-07-06 MRN: [id number]  Date of admission: 11/19/2022 Delivery date:11/20/2022  Delivering provider: Adam Phenix  Date of discharge: 11/21/2022  Admitting diagnosis: Decreased fetal movement affecting management of pregnancy in third trimester, single or unspecified fetus [O36.8130] Indication for care in labor and delivery, antepartum [O75.9] Intrauterine pregnancy: [redacted]w[redacted]d     Secondary diagnosis:  Principal Problem:   Decreased fetal movement affecting management of pregnancy in third trimester, single or unspecified fetus Active Problems:   Obesity affecting pregnancy   Indication for care in labor and delivery, antepartum  Additional problems: none    Discharge diagnosis: Term Pregnancy Delivered and Gestational Hypertension                                              Post partum procedures: none Augmentation: AROM and Pitocin Complications: None  Hospital course: Induction of Labor With Vaginal Delivery   22 y.o. yo [id number] at [redacted]w[redacted]d was admitted to the hospital 11/19/2022 for induction of labor.  Indication for induction: Gestational hypertension.  Patient had an uncomplicated labor course. Membrane Rupture Time/Date: 3:32 PM ,11/20/2022   Delivery Method:Vaginal, Spontaneous  Episiotomy: None  Lacerations:  None  Details of delivery can be found in separate delivery note.  Patient had a postpartum course complicated by some mild range BP elevations requiring Lasix/ProcardiaXL 30mg . Patient is discharged home 11/21/22.  Newborn Data: Birth date:11/20/2022  Birth time:5:20 PM  Gender:Female  Living status:Living  Apgars:9 ,9  Weight:3380 g (7lb 7.2oz)  Magnesium Sulfate received: No BMZ received: No Rhophylac:N/A MMR:N/A T-DaP:Given prenatally Flu: Yes Transfusion:No  Physical exam  Vitals:   11/20/22 1845 11/20/22 1945 11/20/22 2340 11/21/22 0415  BP: 129/65 139/72 136/89 (!) 141/75   Pulse: 96 78 67 73  Resp: 17 18 18 17   Temp: 98.6 F (37 C) 98.1 F (36.7 C) 98.7 F (37.1 C) 98.4 F (36.9 C)  TempSrc: Oral Oral Oral Oral  SpO2: 99% 100% 97% 98%  Weight:      Height:       General: alert and cooperative Lochia: appropriate Uterine Fundus: firm Incision: N/A DVT Evaluation: No evidence of DVT seen on physical exam. Labs: Lab Results  Component Value Date   WBC 11.1 (H) 11/21/2022   HGB 10.2 (L) 11/21/2022   HCT 31.7 (L) 11/21/2022   MCV 75.3 (L) 11/21/2022   PLT 265 11/21/2022      Latest Ref Rng & Units 11/19/2022   11:06 PM  CMP  Glucose 70 - 99 mg/dL 92   BUN 6 - 20 mg/dL 8   Creatinine 1.47 - 8.29 mg/dL 5.62   Sodium 130 - 865 mmol/L 136   Potassium 3.5 - 5.1 mmol/L 4.2   Chloride 98 - 111 mmol/L 105   CO2 22 - 32 mmol/L 20   Calcium 8.9 - 10.3 mg/dL 9.3   Total Protein 6.5 - 8.1 g/dL 6.4   Total Bilirubin 0.3 - 1.2 mg/dL 0.2   Alkaline Phos 38 - 126 U/L 205   AST 15 - 41 U/L 23   ALT 0 - 44 U/L 14    Edinburgh Score:    11/20/2022    7:45 PM  Edinburgh Postnatal Depression Scale Screening Tool  I have been able to laugh and see the funny side of things.  0  I have looked forward with enjoyment to things. 0  I have blamed myself unnecessarily when things went wrong. 0  I have been anxious or worried for no good reason. 0  I have felt scared or panicky for no good reason. 0  Things have been getting on top of me. 0  I have been so unhappy that I have had difficulty sleeping. 0  I have felt sad or miserable. 0  I have been so unhappy that I have been crying. 0  The thought of harming myself has occurred to me. 0  Edinburgh Postnatal Depression Scale Total 0     After visit meds:  Allergies as of 11/21/2022   No Known Allergies      Medication List     STOP taking these medications    aspirin EC 81 MG tablet       TAKE these medications    furosemide 20 MG tablet Commonly known as: LASIX Take 1 tablet (20 mg total)  by mouth daily.   ibuprofen 600 MG tablet Commonly known as: ADVIL Take 1 tablet (600 mg total) by mouth every 6 (six) hours as needed.   multivitamin-prenatal 27-0.8 MG Tabs tablet Take 1 tablet by mouth daily at 12 noon.   NIFEdipine 30 MG 24 hr tablet Commonly known as: ADALAT CC Take 1 tablet (30 mg total) by mouth daily.         Discharge home in stable condition Infant Feeding: Breast Infant Disposition:home with mother Discharge instruction: per After Visit Summary and Postpartum booklet. Activity: Advance as tolerated. Pelvic rest for 6 weeks.  Diet: routine diet Future Appointments: Future Appointments  Date Time Provider Department Center  11/22/2022  8:30 AM [facility] NURSE [facility]  11/22/2022  8:45 AM [facility] NST [facility]  11/25/2022  8:50 AM Penne Lash, Fredrich Romans, MD [facility]   Follow up Visit:   Please schedule this patient for a In person postpartum visit in 6 weeks with the following provider: Any provider. Additional Postpartum F/U:BP check 1 week  Low risk pregnancy complicated by:  intrapartum gestational hypertension  Delivery mode:  Vaginal, Spontaneous  Anticipated Birth Control:   declined    11/21/2022 Arabella Merles, CNM 9:09 AM

## 2022-11-20 NOTE — Progress Notes (Signed)
Labor Progress Note Lindsey Bennett is a 22 y.o. [id number] at [redacted]w[redacted]d presented for IOL.   S: Having discomfort with contractions.   O:  BP 125/65   Pulse 86   Temp 98.8 F (37.1 C)   Resp 17   Ht 5\' 1"  (1.549 m)   Wt 105.2 kg   LMP 02/21/2022   SpO2 99%   BMI 43.84 kg/m  EFM: 140bpm/moderate/+accels, variable decels  CVE: Dilation: 3.5 Effacement (%): 80 Station: -3 Exam by:: Dr. Salvadore Dom   A&P: 22 y.o. [id number] [redacted]w[redacted]d here for IOL 2/2 DFM, BPP 6/10 and elevated BP readings.  #Labor: Unchanged. Start pitocin. Consider AROM at next exam.  #Pain: Maternal support #FWB: Cat II, IVF and optimize maternal position  #GBS negative  gHTN BP mild range -BP protocol  Lindsey [place] Autry-Lott, DO 5:11 AM

## 2022-11-20 NOTE — Progress Notes (Signed)
Lindsey Bennett is a 22 y.o. [id number] at [redacted]w[redacted]d by LMP admitted for induction of labor due to gestational Hypertension.  Subjective: Patient doing well. Introductions exchanged. She reports that she wishes this labor was going as fast as her previous labor.   Objective: BP (!) 140/81 (BP Location: Right Arm)   Pulse 73   Temp 97.9 F (36.6 C) (Oral)   Resp 18   Ht 5\' 1"  (1.549 m)   Wt 105.2 kg   LMP 02/21/2022   SpO2 99%   BMI 43.84 kg/m  No intake/output data recorded. No intake/output data recorded.  FHT:  FHR: 140 bpm, variability: moderate,  accelerations:  Present,  decelerations:  Absent UC:   irregular, every 3-7 minutes SVE:   Dilation: 3.5 Effacement (%): 60 Station: -1 Exam by:: Suzie Portela, CNM  Labs: Lab Results  Component Value Date   WBC 9.0 11/19/2022   HGB 11.1 (L) 11/19/2022   HCT 35.8 (L) 11/19/2022   MCV 76.0 (L) 11/19/2022   PLT 314 11/19/2022    Assessment / Plan: Induction of labor due to gestational hypertension currently on 8 mil units of pit.   Labor:  Minimal cervical change. Since starting Pit. Patient also stationary. Continue to increase pit 2x2 as needed. Encouraged patient to get up and move around the room to help encourage labor progression.  Fetal Wellbeing:  Category II Pain Control:  Labor support without medications I/D:   GBS Negative  Anticipated MOD:  NSVD  Claudette Head, CNM 11/20/2022, 9:54 AM

## 2022-11-21 ENCOUNTER — Telehealth: Payer: Self-pay | Admitting: *Deleted

## 2022-11-21 ENCOUNTER — Other Ambulatory Visit (HOSPITAL_COMMUNITY): Payer: Self-pay

## 2022-11-21 ENCOUNTER — Encounter: Payer: Self-pay | Admitting: *Deleted

## 2022-11-21 LAB — CBC
HCT: 31.7 % — ABNORMAL LOW (ref 36.0–46.0)
Hemoglobin: 10.2 g/dL — ABNORMAL LOW (ref 12.0–15.0)
MCH: 24.2 pg — ABNORMAL LOW (ref 26.0–34.0)
MCHC: 32.2 g/dL (ref 30.0–36.0)
MCV: 75.3 fL — ABNORMAL LOW (ref 80.0–100.0)
Platelets: 265 10*3/uL (ref 150–400)
RBC: 4.21 MIL/uL (ref 3.87–5.11)
RDW: 15.5 % (ref 11.5–15.5)
WBC: 11.1 10*3/uL — ABNORMAL HIGH (ref 4.0–10.5)
nRBC: 0 % (ref 0.0–0.2)

## 2022-11-21 MED ORDER — NIFEDIPINE ER 30 MG PO TB24
30.0000 mg | ORAL_TABLET | Freq: Every day | ORAL | 1 refills | Status: DC
  Filled 2022-11-21: qty 30, 30d supply, fill #0

## 2022-11-21 MED ORDER — FUROSEMIDE 20 MG PO TABS
20.0000 mg | ORAL_TABLET | Freq: Every day | ORAL | Status: DC
  Administered 2022-11-21: 20 mg via ORAL
  Filled 2022-11-21: qty 1

## 2022-11-21 MED ORDER — FUROSEMIDE 20 MG PO TABS
20.0000 mg | ORAL_TABLET | Freq: Every day | ORAL | 0 refills | Status: DC
  Filled 2022-11-21: qty 4, 4d supply, fill #0

## 2022-11-21 MED ORDER — NIFEDIPINE ER OSMOTIC RELEASE 30 MG PO TB24
30.0000 mg | ORAL_TABLET | Freq: Every day | ORAL | Status: DC
  Administered 2022-11-21: 30 mg via ORAL
  Filled 2022-11-21: qty 1

## 2022-11-21 MED ORDER — IBUPROFEN 600 MG PO TABS
600.0000 mg | ORAL_TABLET | Freq: Four times a day (QID) | ORAL | 0 refills | Status: DC | PRN
  Filled 2022-11-21: qty 30, 8d supply, fill #0

## 2022-11-21 NOTE — Telephone Encounter (Signed)
Left patient an urgent message to keep appointment on 11/25/2022 for 1 week BP check and to call the office to schedule 6 week Postpartum appointment or schedule when she comes on 11/25/2022.

## 2022-11-21 NOTE — Lactation Note (Addendum)
This note was copied from a baby's chart. Lactation Consultation Note  Patient Name: Lindsey Bennett Lindsey Bennett Date: 11/21/2022 Age:24 hours Reason for consult: Initial assessment;Early term 37-38.6wks Experienced BF Mom holding baby STS. Mom stated baby BF well. Denies painful latch.  LC left and came back mom had baby on the breast. Baby sleepy needing stimulation to feed. Mom stated she has no questions at this time. Mom encouraged to feed baby 8-12 times/24 hours and with feeding cues.  Newborn feeding habits, I&O, STS, reviewed. Encouraged mom to call for assistance as needed.  Maternal Data Has patient been taught Hand Expression?: Yes  Feeding    LATCH Score       Type of Nipple: Everted at rest and after stimulation (very short shaft/semi flat/compressible)  Comfort (Breast/Nipple): Soft / non-tender  Hold (Positioning): No assistance needed to correctly position infant at breast.      Lactation Tools Discussed/Used    Interventions Interventions: Breast feeding basics reviewed;Skin to skin;Hand express;LC Services brochure  Discharge    Consult Status Consult Status: Follow-up Date: 11/21/22 (in pm) Follow-up type: In-patient    Charyl Dancer 11/21/2022, 2:37 AM

## 2022-11-22 ENCOUNTER — Ambulatory Visit: Payer: Medicaid Other

## 2022-11-24 IMAGING — US US MFM OB DETAIL+14 WK
1 series · 13 of 28 positions shown · non-contrast
Comparison: none

[Series 1: us mfm ob detail+14 wk · 106 acquisitions, 13 frames shown]
[im 4/106]
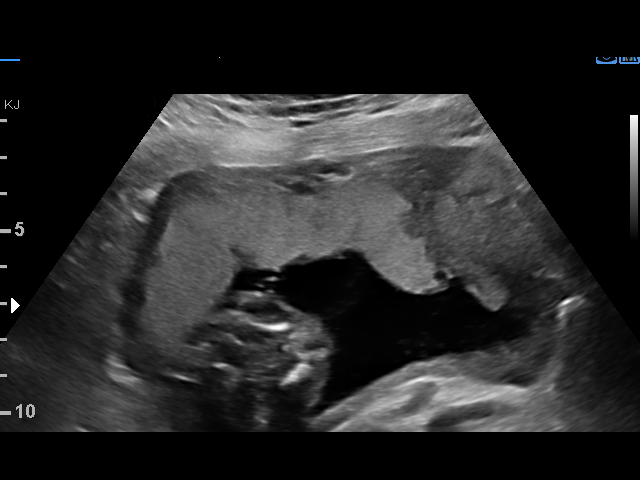
[im 12/106]
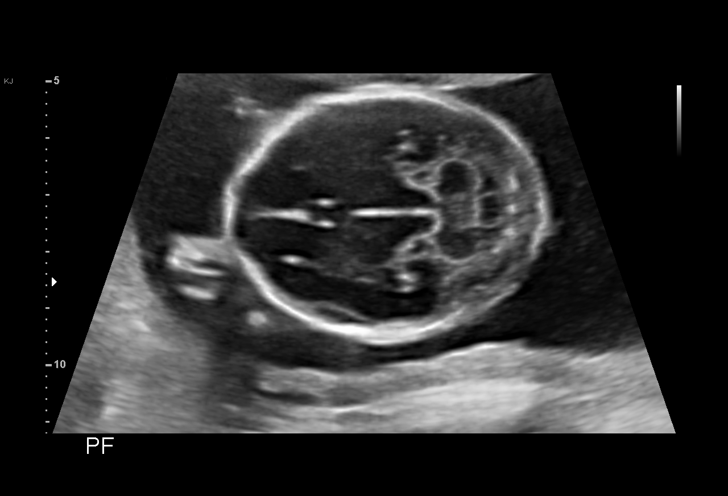
[im 20/106]
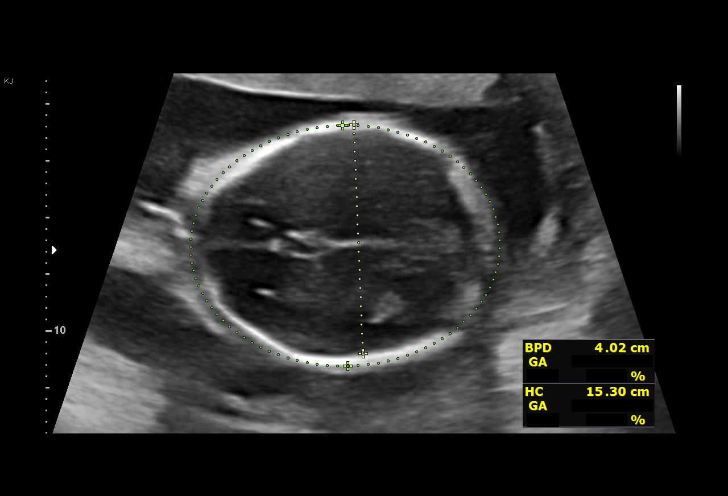
[im 28/106]
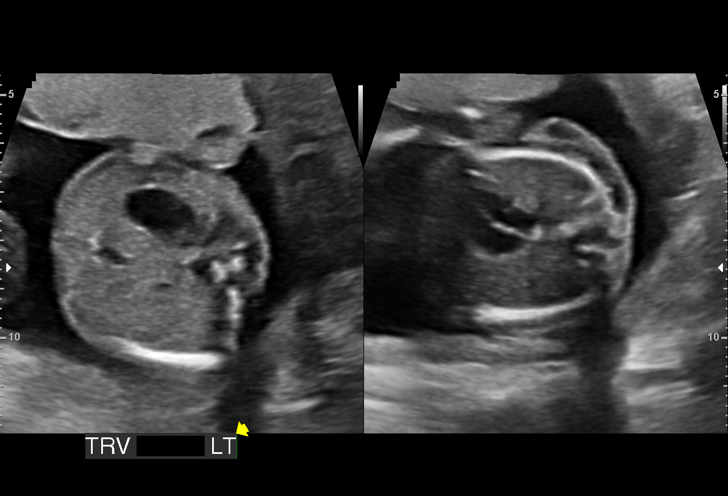
[im 36/106]
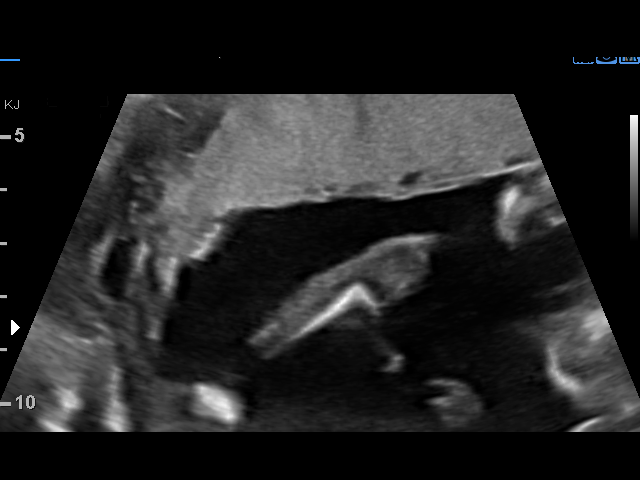
[im 43/106]
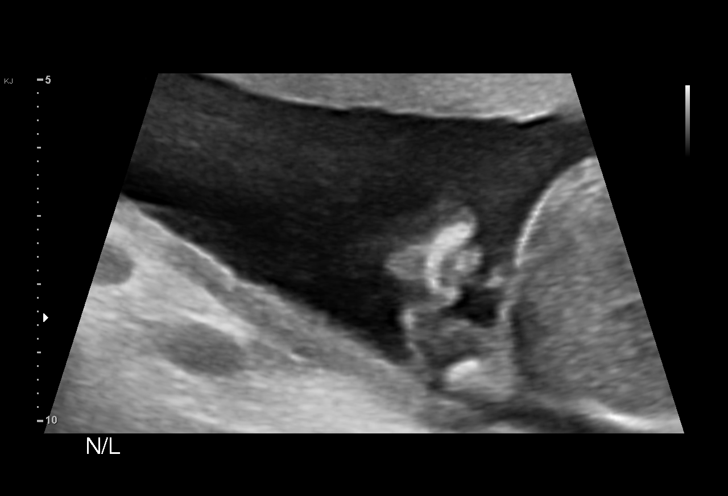
[im 55/106]
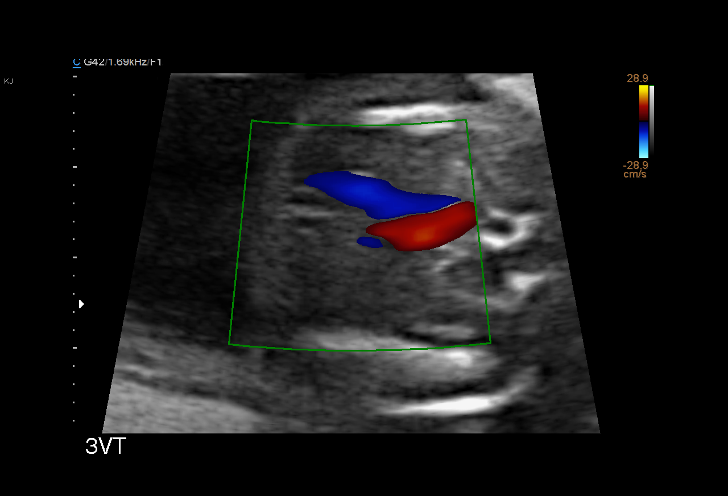
[im 63/106]
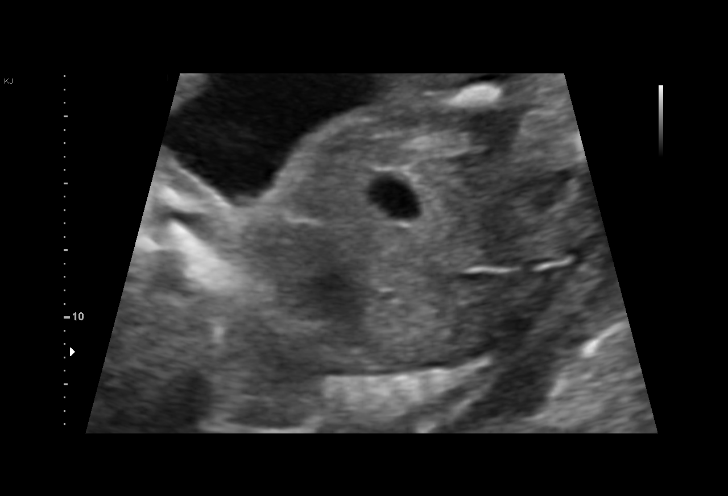
[im 71/106]
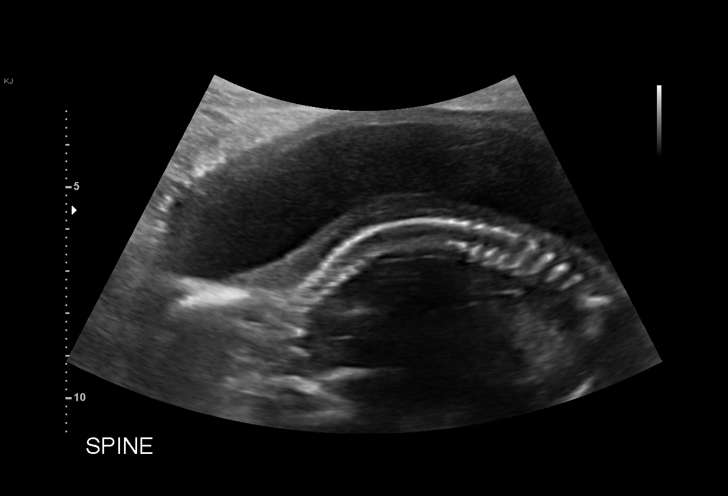
[im 78/106]
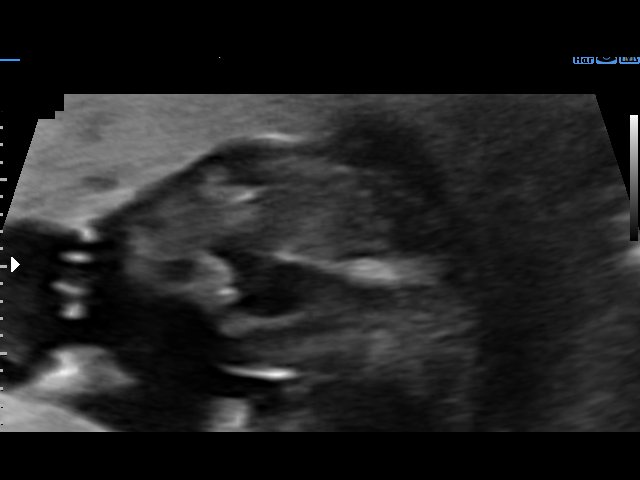
[im 86/106]
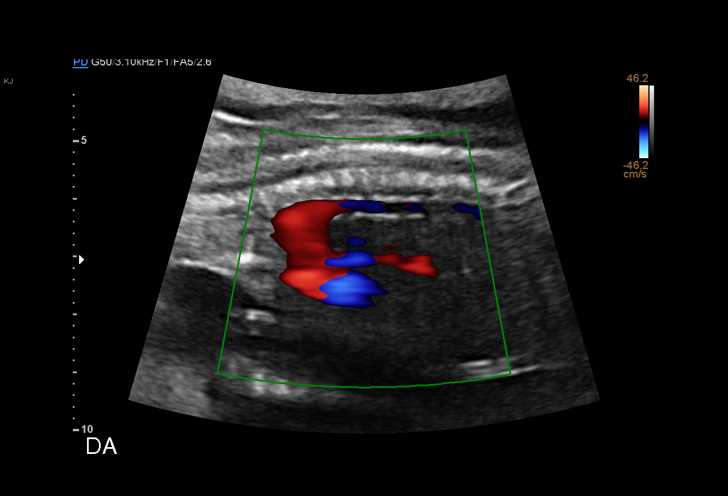
[im 94/106]
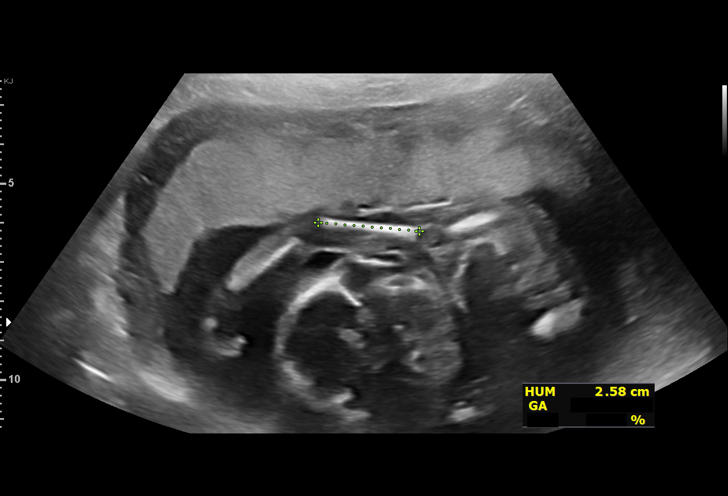
[im 102/106]
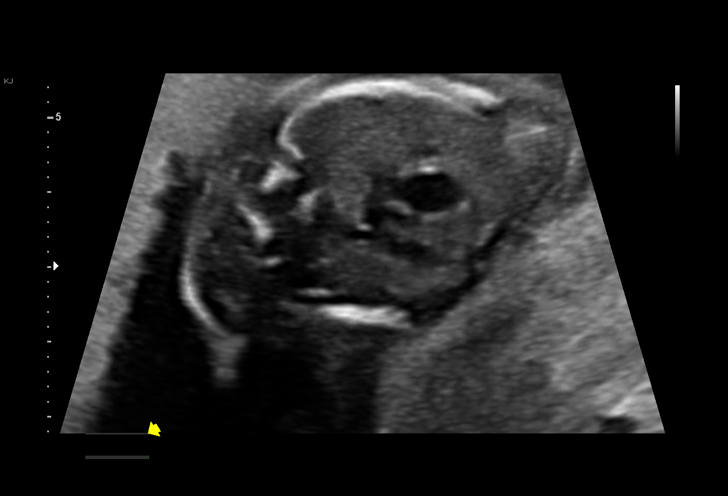

[13 of 28 positions shown; findings below may reference images not displayed]

Indications

 19 weeks gestation of pregnancy
 Poor obstetric history: Previous preeclampsia
 Obesity complicating pregnancy, second
 trimester
Fetal Evaluation

 Num Of Fetuses:         1
 Fetal Heart Rate(bpm):  143
 Cardiac Activity:       Observed
 Presentation:           Transverse, head to maternal left
 Placenta:               Anterior
 P. Cord Insertion:      Visualized, central

 Amniotic Fluid
 AFI FV:      Within normal limits
Biometry

 BPD:      40.3  mm     G. Age:  18w 2d         19  %    CI:        69.97   %    70 - 86
                                                         FL/HC:      17.4   %    16.1 -
 HC:      153.7  mm     G. Age:  18w 2d         15  %    HC/AC:      1.09        1.09 -
 AC:      140.7  mm     G. Age:  19w 3d         61  %    FL/BPD:     66.3   %
 FL:       26.7  mm     G. Age:  18w 1d         15  %    FL/AC:      19.0   %    20 - 24
 HUM:      25.8  mm     G. Age:  18w 0d         25  %
 CER:      19.3  mm     G. Age:  18w 6d         26  %
 NFT:       4.6  mm

 LV:        4.5  mm
 CM:        2.4  mm

 Est. FW:     256  gm      0 lb 9 oz     32  %
OB History

 Gravidity:    4         Term:   1         SAB:   2
 Living:       1
Gestational Age

 LMP:           19w 0d        Date:  05/26/20                 EDD:   03/02/21
 U/S Today:     18w 4d                                        EDD:   03/05/21
 Best:          19w 0d     Det. By:  LMP  (05/26/20)          EDD:   03/02/21
Anatomy

 Cranium:               Appears normal         Aortic Arch:            Appears normal
 Cavum:                 Appears normal         Ductal Arch:            Appears normal
 Ventricles:            Appears normal         Diaphragm:              Appears normal
 Choroid Plexus:        Appears normal         Stomach:                Appears normal, left
                                                                       sided
 Cerebellum:            Appears normal         Abdomen:                Appears normal
 Posterior Fossa:       Appears normal         Abdominal Wall:         Appears nml (cord
                                                                       insert, abd wall)
 Nuchal Fold:           Appears normal         Cord Vessels:           Appears normal (3
                                                                       vessel cord)
 Face:                  Orbits appear          Kidneys:                Appear normal
                        normal, Profile not
                        vis
 Lips:                  Appears normal         Bladder:                Appears normal
 Thoracic:              Appears normal         Spine:                  Appears normal
 Heart:                 Appears normal         Upper Extremities:      Appears normal
                        (4CH, axis, and
                        situs)
 RVOT:                  Not well visualized    Lower Extremities:      Appears normal
 LVOT:                  Appears normal

 Other:  Fetus appears to be a male. VC, 3VV and 3VTV visualized. Heels
         and 5th digit visualized. Open hands visualized.
Cervix Uterus Adnexa

 Cervix
 Length:           3.56  cm.
 Normal appearance by transabdominal scan.

 Right Ovary
 Within normal limits.

 Left Ovary
 Not visualized.

 Adnexa
 No abnormality visualized.
Impression

 G3 P1.  Patient is here for fetal anatomy scan.  She had
 opted not to screen for fetal aneuploidies.
 Obstetric history significant for a pregnancy complicated by
 preeclampsia.  She takes low-dose aspirin prophylaxis.
 We performed fetal anatomy scan. No makers of
 aneuploidies or fetal structural defects are seen. Fetal
 biometry is consistent with her previously-established dates.
 Amniotic fluid is normal and good fetal activity is seen.
 Patient understands the limitations of ultrasound in detecting
 fetal anomalies.
 Maternal obesity imposes limitations on the resolution of
 images, and failure to detect fetal anomalies is more common
 in obese pregnant women.
Recommendations

 -An appointment was made for her to return in 4 weeks for
 completion of fetal anatomy.
                 Jumper, Rudi

## 2022-11-25 ENCOUNTER — Encounter: Payer: Self-pay | Admitting: Obstetrics & Gynecology

## 2022-11-25 ENCOUNTER — Ambulatory Visit (INDEPENDENT_AMBULATORY_CARE_PROVIDER_SITE_OTHER): Payer: Medicaid Other | Admitting: Obstetrics & Gynecology

## 2022-11-25 VITALS — BP 131/67 | HR 81 | Ht 61.0 in | Wt 214.0 lb

## 2022-11-25 DIAGNOSIS — O133 Gestational [pregnancy-induced] hypertension without significant proteinuria, third trimester: Secondary | ICD-10-CM

## 2022-11-25 DIAGNOSIS — O165 Unspecified maternal hypertension, complicating the puerperium: Secondary | ICD-10-CM

## 2022-11-25 NOTE — Progress Notes (Signed)
   Subjective:    Patient ID: [name], female    DOB: 02-14-2001, 22 y.o.   MRN: [id number]  HPI  No problems; No HA, scotomata, RUQ pain; taking procardia  Review of Systems  Constitutional: Negative.   Respiratory: Negative.    Cardiovascular: Negative.   Gastrointestinal: Negative.   Genitourinary: Negative.        Objective:   Physical Exam Vitals reviewed.  Constitutional:      General: She is not in acute distress.    Appearance: She is well-developed.  HENT:     Head: Normocephalic and atraumatic.  Eyes:     Conjunctiva/sclera: Conjunctivae normal.  Cardiovascular:     Rate and Rhythm: Normal rate.  Pulmonary:     Effort: Pulmonary effort is normal.  Skin:    General: Skin is warm and dry.  Neurological:     Mental Status: She is alert and oriented to person, place, and time.  Psychiatric:        Mood and Affect: Mood normal.    Vitals:   11/25/22 0858  BP: 131/67  Pulse: 81  Weight: 214 lb (97.1 kg)  Height: 5\' 1"  (1.549 m)       Assessment & Plan:  22 yo female for BP check 1 week after delivery Pt states she is on pp Baby Rx Continue procardia for now; send [place] message if BP drops or feels lightheaded.  RTC 4-5 weeks.

## 2022-12-17 NOTE — Progress Notes (Unsigned)
Post Partum Visit Note  Lindsey Bennett is a 22 y.o. [phone] female who presents for a postpartum visit. She is 4 weeks postpartum following a normal spontaneous vaginal delivery.  I have fully reviewed the prenatal and intrapartum course. The delivery was at 38.6 gestational weeks.  Anesthesia: none. Postpartum course has been unremarkable. Baby is doing well. Baby is feeding by both breast and bottle - Similac Advance. Bleeding staining only. Bowel function is normal. Bladder function is normal. Patient is not sexually active. Contraception method is none. Postpartum depression screening: negative.   Upstream - 12/19/22 1625       Pregnancy Intention Screening   Does the patient want to become pregnant in the next year? No    Does the patient's partner want to become pregnant in the next year? No    Would the patient like to discuss contraceptive options today? No      Contraception Wrap Up   Current Method No Contraceptive Precautions    End Method No Contraception Precautions    Contraception Counseling Provided No    How was the end contraceptive method provided? N/A            The pregnancy intention screening data noted above was reviewed. Potential methods of contraception were discussed. The patient elected to proceed with No Contraception Precautions.   Edinburgh Postnatal Depression Scale - 12/19/22 1556       Edinburgh Postnatal Depression Scale:  In the Past 7 Days   I have been able to laugh and see the funny side of things. 0    I have looked forward with enjoyment to things. 0    I have blamed myself unnecessarily when things went wrong. 0    I have been anxious or worried for no good reason. 0    I have felt scared or panicky for no good reason. 0    Things have been getting on top of me. 0    I have been so unhappy that I have had difficulty sleeping. 0    I have felt sad or miserable. 0    I have been so unhappy that I have been crying. 0    The thought  of harming myself has occurred to me. 0    Edinburgh Postnatal Depression Scale Total 0             Health Maintenance Due  Topic Date Due   COVID-19 Vaccine (1) Never done   HPV VACCINES (1 - 2-dose series) Never done    Pertinent items are noted in HPI.  Review of Systems Pertinent items are noted in HPI.  Objective:  BP 128/80   Pulse (!) 56   Ht 5\' 1"  (1.549 m)   Wt 213 lb (96.6 kg)   LMP 02/21/2022   BMI 40.25 kg/m    General:  alert, cooperative, and no distress   Breasts:  declined  Lungs: Normal effort  Heart:  Normal rate  Abdomen: Soft, non tender    Wound N/a  GU exam:   declined       Assessment:   Normal postpartum exam 2. Postpartum hypertension - has stopped procardia. Normotensive today. No further antihypertensive medications indicated at this time  Plan:   Essential components of care per ACOG recommendations:  1.  Mood and well being: Patient with negative depression screening today. Reviewed local resources for support.  - Patient tobacco use? No.   - hx of drug use? No.  2. Infant care and feeding:  -Patient currently breastmilk feeding? Yes. Reviewed importance of draining breast regularly to support lactation.  -Social determinants of health (SDOH) reviewed in EPIC. No concerns  3. Sexuality, contraception and birth spacing - Patient does not want a pregnancy in the next year.  Desired family size is 4 children.  - Reviewed reproductive life planning. Reviewed contraceptive methods based on pt preferences and effectiveness.  Patient desired No Method - Other Reason today.   - Discussed birth spacing of 18 months  4. Sleep and fatigue -Encouraged family/partner/community support of 4 hrs of uninterrupted sleep to help with mood and fatigue  5. Physical Recovery  - Discussed patients delivery and complications. She describes her labor as good. - Patient had a Vaginal, no problems at delivery. - Patient has urinary incontinence?  No. - Patient is safe to resume physical and sexual activity  6.  Health Maintenance - HM due items addressed Yes - Last pap smear  Diagnosis  Date Value Ref Range Status  08/04/2020   Final   - Negative for Intraepithelial Lesions or Malignancy (NILM)  08/04/2020 - Benign reactive/reparative changes  Final   Pap smear not done at today's visit.  -Breast Cancer screening indicated? No.   7. Chronic Disease/Pregnancy Condition follow up: None  Lennart Pall, MD [facility] [employer], [facility]

## 2022-12-19 ENCOUNTER — Ambulatory Visit (INDEPENDENT_AMBULATORY_CARE_PROVIDER_SITE_OTHER): Payer: Medicaid Other | Admitting: Obstetrics and Gynecology

## 2022-12-19 DIAGNOSIS — O165 Unspecified maternal hypertension, complicating the puerperium: Secondary | ICD-10-CM

## 2022-12-19 NOTE — Progress Notes (Signed)
Pt declined pelvic exam

## 2023-12-23 ENCOUNTER — Telehealth: Payer: Self-pay | Admitting: *Deleted

## 2023-12-23 NOTE — Telephone Encounter (Signed)
Left patient a message to call the office to schedule annual. ?

## 2024-02-10 ENCOUNTER — Ambulatory Visit: Admitting: Obstetrics and Gynecology
